# Patient Record
Sex: Male | Born: 1940 | Race: White | Hispanic: No | Marital: Married | Smoking: Former smoker
Health system: Southern US, Community
[De-identification: ages and names within clinical notes are randomized; demographics above are authoritative.]

## PROBLEM LIST (undated history)

## (undated) DIAGNOSIS — Z973 Presence of spectacles and contact lenses: Secondary | ICD-10-CM

## (undated) DIAGNOSIS — K08109 Complete loss of teeth, unspecified cause, unspecified class: Secondary | ICD-10-CM

## (undated) DIAGNOSIS — E785 Hyperlipidemia, unspecified: Secondary | ICD-10-CM

## (undated) DIAGNOSIS — Z9289 Personal history of other medical treatment: Secondary | ICD-10-CM

## (undated) DIAGNOSIS — I10 Essential (primary) hypertension: Secondary | ICD-10-CM

## (undated) DIAGNOSIS — H409 Unspecified glaucoma: Secondary | ICD-10-CM

## (undated) DIAGNOSIS — H532 Diplopia: Secondary | ICD-10-CM

## (undated) DIAGNOSIS — M1990 Unspecified osteoarthritis, unspecified site: Secondary | ICD-10-CM

## (undated) DIAGNOSIS — Z972 Presence of dental prosthetic device (complete) (partial): Secondary | ICD-10-CM

## (undated) DIAGNOSIS — K219 Gastro-esophageal reflux disease without esophagitis: Secondary | ICD-10-CM

## (undated) HISTORY — DX: Gastro-esophageal reflux disease without esophagitis: K21.9

## (undated) HISTORY — DX: Essential (primary) hypertension: I10

## (undated) HISTORY — PX: PROSTATE BIOPSY: SHX241

## (undated) HISTORY — DX: Hyperlipidemia, unspecified: E78.5

---

## 2001-08-12 ENCOUNTER — Encounter: Payer: Self-pay | Admitting: *Deleted

## 2001-08-12 ENCOUNTER — Ambulatory Visit (HOSPITAL_COMMUNITY): Admission: RE | Admit: 2001-08-12 | Discharge: 2001-08-12 | Payer: Self-pay | Admitting: *Deleted

## 2001-11-07 ENCOUNTER — Encounter: Admission: RE | Admit: 2001-11-07 | Discharge: 2002-02-05 | Payer: Self-pay | Admitting: Internal Medicine

## 2004-10-29 HISTORY — PX: COLONOSCOPY: SHX174

## 2005-05-09 ENCOUNTER — Ambulatory Visit: Payer: Self-pay | Admitting: Gastroenterology

## 2005-05-15 ENCOUNTER — Ambulatory Visit: Payer: Self-pay | Admitting: Gastroenterology

## 2007-10-30 HISTORY — PX: EYE SURGERY: SHX253

## 2008-10-29 HISTORY — PX: EYE SURGERY: SHX253

## 2010-03-10 ENCOUNTER — Emergency Department (HOSPITAL_COMMUNITY): Admission: EM | Admit: 2010-03-10 | Discharge: 2010-03-10 | Payer: Self-pay | Admitting: Emergency Medicine

## 2011-01-16 LAB — URINALYSIS, ROUTINE W REFLEX MICROSCOPIC
Glucose, UA: NEGATIVE mg/dL
Nitrite: NEGATIVE
Protein, ur: 100 mg/dL — AB
Specific Gravity, Urine: 1.01 (ref 1.005–1.030)
Urobilinogen, UA: 0.2 mg/dL (ref 0.0–1.0)
pH: 7 (ref 5.0–8.0)

## 2011-01-16 LAB — URINE MICROSCOPIC-ADD ON

## 2011-10-30 DIAGNOSIS — Z9289 Personal history of other medical treatment: Secondary | ICD-10-CM

## 2011-10-30 HISTORY — DX: Personal history of other medical treatment: Z92.89

## 2012-04-11 ENCOUNTER — Encounter: Payer: Self-pay | Admitting: *Deleted

## 2012-08-05 ENCOUNTER — Encounter: Payer: Self-pay | Admitting: Cardiovascular Disease

## 2012-08-08 ENCOUNTER — Other Ambulatory Visit: Payer: Self-pay | Admitting: Urology

## 2012-08-14 ENCOUNTER — Encounter (HOSPITAL_COMMUNITY): Payer: Self-pay | Admitting: Pharmacy Technician

## 2012-08-20 ENCOUNTER — Encounter (HOSPITAL_COMMUNITY)
Admission: RE | Admit: 2012-08-20 | Discharge: 2012-08-20 | Disposition: A | Discharge status: Home / Self Care | Payer: Medicare Other | Source: Ambulatory Visit | Attending: Urology | Admitting: Urology

## 2012-08-20 ENCOUNTER — Ambulatory Visit (HOSPITAL_COMMUNITY)
Admission: RE | Admit: 2012-08-20 | Discharge: 2012-08-20 | Disposition: A | Discharge status: Home / Self Care | Payer: Medicare Other | Source: Ambulatory Visit | Attending: Urology | Admitting: Urology

## 2012-08-20 ENCOUNTER — Encounter (HOSPITAL_COMMUNITY): Payer: Self-pay

## 2012-08-20 DIAGNOSIS — Z01812 Encounter for preprocedural laboratory examination: Secondary | ICD-10-CM | POA: Insufficient documentation

## 2012-08-20 DIAGNOSIS — I10 Essential (primary) hypertension: Secondary | ICD-10-CM | POA: Insufficient documentation

## 2012-08-20 DIAGNOSIS — Z01818 Encounter for other preprocedural examination: Secondary | ICD-10-CM | POA: Insufficient documentation

## 2012-08-20 LAB — BASIC METABOLIC PANEL
Chloride: 104 mEq/L (ref 96–112)
Creatinine, Ser: 0.89 mg/dL (ref 0.50–1.35)
GFR calc Af Amer: >90 mL/min (ref >90)
GFR calc non Af Amer: 83 mL/min — ABNORMAL LOW (ref >90)
Potassium: 4.2 mEq/L (ref 3.5–5.1)

## 2012-08-20 LAB — CBC
MCHC: 33.1 g/dL (ref 30.0–36.0)
Platelets: 263 10*3/uL (ref 150–400)
RDW: 12.7 % (ref 11.5–15.5)
WBC: 7.8 10*3/uL (ref 4.0–10.5)

## 2012-08-20 LAB — SURGICAL PCR SCREEN: Staphylococcus aureus: NEGATIVE

## 2012-08-20 NOTE — Progress Notes (Signed)
Small red rash on abdomen pt instructed to have medicl dr check rash, call rn if any meds given for rash

## 2012-08-20 NOTE — Patient Instructions (Addendum)
20 Ryan Meza  08/20/2012   Your procedure is scheduled on:  08-26-2012  Report to [facility] at 0530  AM.  Call this number if you have problems the morning of surgery: [phone]   Remember:   Do not eat food or drink liquids:After Midnight.  .  Take these medicines the morning of surgery with A SIP OF WATER: combigan eye drop, omeprazole   Do not wear jewelry or make up.  Do not wear lotions, powders, or perfumes. You may wear deodorant.    Do not bring valuables to the hospital.  Contacts, dentures or bridgework may not be worn into surgery.  Leave suitcase in the car. After surgery it may be brought to your room.  For patients admitted to the hospital, checkout time is 11:00 AM the day of discharge                             Patients discharged the day of surgery will not be allowed to drive home. If going home same day of surgery, you must have someone stay with you the first 24 hours at home and arrange for some one to drive you home from hospital.    Special Instructions: See [facility] Preparing for Surgery instruction sheet. Women do not shave legs or underarms for 12 hours before showers. Men may shave face morning of surgery.    Please read over the following fact sheets that you were given: MRSA Information  [facility] pre op nurse phone number [phone], call if needed  Have medicla dr check rash on abdomen call Charlita Brian if any meds given

## 2012-08-22 NOTE — Progress Notes (Signed)
Pt called and said he saw family md and given cream 1% eucerin  3 x day  For rash on abdomen and ok for surgery.

## 2012-08-25 NOTE — H&P (Signed)
History of Present Illness       F/u BPH, elevated PSA and gross hematuria.  1- elevated PSA --2007 PSA 11.67, prostate biopsy benign, 195 g prostate. A 5alphaRI was added to the tamsulosin in 2007.  --Sept 2012 PSA 3.47 (6.94)   2- BPH - he continues on tamsulosin 0.4 mg 1 po daily and finasteride 5 mg 1 po daily.  3- gross hematuria - gross hematuria in 2011. A cystoscopy was negative May 2011 apart from BPH.  4- urinary retention - AUR several yrs ago with foley X 3 days. Presented Oct 2013 with AUR and PVR - 883 ml. Foley was placed.   Interval HX:  Followup review urodynamics.   Urodynamics revealed a bladder capacity of about 300 mL. The bladder had unstable contractions. There is no stress incontinence. He did develop a good voluntary detrusor contraction but had an obstructive flow pattern.  Prostate ultrasound-215 g. Otherwise normal. Details scanned in the chart.   Past Medical History Problems  1. History of  Elective Circumcision V50.2 2. History of  Glaucoma 365.9 3. History of  Hypercholesterolemia 272.0  Surgical History Problems  1. History of  Cataract Surgery 2. History of  Eye Surgery  Current Meds 1. Aspirin 81 MG Oral Tablet; Therapy: (Recorded:31Dec2007) to 2. Combigan 0.2-0.5 % Ophthalmic Solution; Therapy: (Recorded:02Sep2011) to 3. Finasteride 5 MG Oral Tablet; Take 1 tablet by mouth every day; Therapy: 17Feb2010 to  (Evaluate:06Mar2014)  Requested for: 11Mar2013; Last Rx:11Mar2013 4. Levofloxacin 500 MG Oral Tablet; Take 1 tablet daily; Therapy: 06Sep2013 to  (Evaluate:13Sep2013)  Requested for: 06Sep2013; Last Rx:06Sep2013 5. Lipitor 10 MG Oral Tablet; Therapy: (Recorded:27Dec2007) to 6. Lisinopril 5 MG Oral Tablet; Therapy: (Recorded:02Sep2011) to 7. Lumigan 0.01 % Ophthalmic Solution; Therapy: 05Jul2013 to 8. Multi-Vitamin TABS; Therapy: (Recorded:31Dec2007) to 9. PriLOSEC OTC 20 MG Oral Tablet Delayed Release; Therapy: (Recorded:02Dec2008)  to 10. Tamsulosin HCl 0.4 MG Oral Capsule; Take 1 capsule by mouth every day; Therapy: 19May2010   to (Evaluate:25Dec2013)  Requested for: 26Sep2013; Last Rx:26Sep2013 11. Vitamin D3 CAPS; Therapy: (Recorded:26Apr2011) to  Allergies Medication  1. No Known Drug Allergies  Family History Problems  1. Paternal history of  Acute Myocardial Infarction V17.3 2. Paternal history of  Death In The Family Father Massive MIAge of death was 20 3. Maternal history of  Death In The Family Mother Old Age Age of death was 65 4. Family history of  Family Health Status Number Of Children Two sons  Social History Problems  1. Caffeine Use 2 per day maybe 2. Marital History - Currently Married 3. Retired From Work 4. History of  Tobacco Use V15.82 quit 36 years ago; smoked 1 1/2 packs per day for 15 years Denied  5. Alcohol Use  Review of Systems Constitutional, cardiovascular, pulmonary, gastrointestinal and neurological system(s) were reviewed and pertinent findings if present are noted.    Physical Exam Constitutional: Well nourished and well developed . No acute distress.  Pulmonary: No respiratory distress and normal respiratory rhythm and effort.  Cardiovascular: Heart rate and rhythm are normal . No peripheral edema.  Genitourinary: Examination of the penis demonstrates no discharge, no masses, no lesions and a normal meatus. The scrotum is without lesions. The right epididymis is palpably normal and non-tender. The left epididymis is palpably normal and non-tender. The right testis is non-tender and without masses. The left testis is non-tender and without masses.  Neuro/Psych:. Mood and affect are appropriate.    Procedure  Procedure: Cystoscopy   Indication: Lower Urinary  Tract Symptoms.  Informed Consent: Risks, benefits, and potential adverse events were discussed and informed consent was obtained from the patient.  Prep: The patient was prepped with betadine.  Procedure Note:   Urethral meatus:. No abnormalities.  Anterior urethra: No abnormalities.  Prostatic urethra: No abnormalities . The lateral and median prostatic lobes were enlarged. An enlarged intravesical median lobe was visualized.  Bladder: Visulization was clear. The ureteral orifices were in the normal anatomic position bilaterally and had clear efflux of urine. A systematic survey of the bladder demonstrated no bladder tumors or stones. The mucosa was smooth without abnormalities. The patient tolerated the procedure well.     Filled 350 ml. voided 100 mL with a slow stream followed later by another 100 mL with a slow stream.  Complications: None.    Assessment Assessed  1. 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 Follow-up Year x 1 Office  Follow-up Benign Prostatic Hypertrophy With Urinary Obstruction (600.01)  2. Cath, simple, wIinsert Temp Cath  Requested for: 09Oct2013 3. Follow-up Schedule Surgery Office  Follow-up  Requested for: 09Oct2013  Discussion/Summary     Using the understanding prostate problems booklet, I discussed with the patient the nature, risks and benefits of various BPH treatments such as surveillance, alpha blockers (syncope and RGE among others), 5alphaRI's (sexual dysfunction and high grade prostate cancer among others), office based procedures (TUMT or TUNA) and procedures in the OR (Greenlight PVP, Bipolar TURP, open simple prostatectomy). All questions answered. He has a large median lobe and may do well after resecting this. He elects to proceed with TURP. We discussed risks of bleeding, infection, persistent retention, incontinence, strictures, fluid overload, thrombotic events, anesthetic complications, worsening/de novo ur and gency, and sexual dysfunction (impotence/retrograde ejaculation) among others. We talked about the 85 to 90 percent success rate with flow symptoms. He understands that  statistically, overactive bladder symptoms could settle down in 65 percent of the cases, persist in a third and worsen in 2 to 3 percent. We discussed treatment goals may not be reached and/or may require multiple procedures. We discussed the likelihood of achieving the goals of the procedure and potential problems that might occur during the procedure or recuperation.       Signatures Electronically signed by : Jerilee Field, M.D.; Aug 06 2012 12:20PM

## 2012-08-26 ENCOUNTER — Encounter (HOSPITAL_COMMUNITY): Payer: Self-pay | Admitting: Anesthesiology

## 2012-08-26 ENCOUNTER — Ambulatory Visit (HOSPITAL_COMMUNITY): Payer: Medicare Other | Admitting: Anesthesiology

## 2012-08-26 ENCOUNTER — Encounter (HOSPITAL_COMMUNITY): Payer: Self-pay | Admitting: *Deleted

## 2012-08-26 ENCOUNTER — Ambulatory Visit (HOSPITAL_COMMUNITY)
Admission: RE | Admit: 2012-08-26 | Discharge: 2012-08-26 | Disposition: A | Discharge status: Home / Self Care | Payer: Medicare Other | Source: Ambulatory Visit | Attending: Urology | Admitting: Urology

## 2012-08-26 ENCOUNTER — Encounter (HOSPITAL_COMMUNITY)
Admission: RE | Disposition: A | Discharge status: Home / Self Care | Payer: Self-pay | Source: Ambulatory Visit | Attending: Urology

## 2012-08-26 DIAGNOSIS — E7800 Pure hypercholesterolemia, unspecified: Secondary | ICD-10-CM | POA: Insufficient documentation

## 2012-08-26 DIAGNOSIS — Z79899 Other long term (current) drug therapy: Secondary | ICD-10-CM | POA: Insufficient documentation

## 2012-08-26 DIAGNOSIS — N138 Other obstructive and reflux uropathy: Secondary | ICD-10-CM | POA: Insufficient documentation

## 2012-08-26 DIAGNOSIS — N401 Enlarged prostate with lower urinary tract symptoms: Secondary | ICD-10-CM | POA: Insufficient documentation

## 2012-08-26 DIAGNOSIS — R972 Elevated prostate specific antigen [PSA]: Secondary | ICD-10-CM | POA: Insufficient documentation

## 2012-08-26 DIAGNOSIS — Z7982 Long term (current) use of aspirin: Secondary | ICD-10-CM | POA: Insufficient documentation

## 2012-08-26 DIAGNOSIS — R338 Other retention of urine: Secondary | ICD-10-CM | POA: Insufficient documentation

## 2012-08-26 DIAGNOSIS — R310 Gross hematuria: Secondary | ICD-10-CM | POA: Insufficient documentation

## 2012-08-26 HISTORY — PX: CYSTOSCOPY: SHX5120

## 2012-08-26 SURGERY — CYSTOSCOPY
Anesthesia: General | Site: Bladder | Wound class: Clean Contaminated

## 2012-08-26 MED ORDER — FUROSEMIDE 10 MG/ML IJ SOLN
INTRAMUSCULAR | Status: DC | PRN
  Administered 2012-08-26: 20 mg via INTRAMUSCULAR

## 2012-08-26 MED ORDER — PROMETHAZINE HCL 25 MG/ML IJ SOLN: 6.2500 mg | INTRAMUSCULAR | Status: DC | PRN

## 2012-08-26 MED ORDER — FUROSEMIDE 10 MG/ML IJ SOLN
20.0000 mg | Freq: Once | INTRAMUSCULAR | Status: DC
  Filled 2012-08-26: qty 2

## 2012-08-26 MED ORDER — INDIGOTINDISULFONATE SODIUM 8 MG/ML IJ SOLN
INTRAMUSCULAR | Status: DC | PRN
  Administered 2012-08-26: 5 mL via INTRAVENOUS

## 2012-08-26 MED ORDER — KETOROLAC TROMETHAMINE 30 MG/ML IJ SOLN: 15.0000 mg | Freq: Once | INTRAMUSCULAR | Status: DC | PRN

## 2012-08-26 MED ORDER — LIDOCAINE HCL (CARDIAC) 20 MG/ML IV SOLN
INTRAVENOUS | Status: DC | PRN
  Administered 2012-08-26: 70 mg via INTRAVENOUS

## 2012-08-26 MED ORDER — LEVOFLOXACIN 500 MG PO TABS: 500.0000 mg | ORAL_TABLET | Freq: Every day | ORAL | Status: DC

## 2012-08-26 MED ORDER — CEFAZOLIN SODIUM-DEXTROSE 2-3 GM-% IV SOLR
INTRAVENOUS | Status: DC | PRN
  Administered 2012-08-26: 2 g via INTRAVENOUS

## 2012-08-26 MED ORDER — LIDOCAINE HCL 2 % EX GEL
CUTANEOUS | Status: AC
  Filled 2012-08-26: qty 10

## 2012-08-26 MED ORDER — MIDAZOLAM HCL 5 MG/5ML IJ SOLN
INTRAMUSCULAR | Status: DC | PRN
  Administered 2012-08-26: 2 mg via INTRAVENOUS

## 2012-08-26 MED ORDER — ACETAMINOPHEN 10 MG/ML IV SOLN
INTRAVENOUS | Status: AC
  Filled 2012-08-26: qty 100

## 2012-08-26 MED ORDER — PROPOFOL 10 MG/ML IV BOLUS
INTRAVENOUS | Status: DC | PRN
  Administered 2012-08-26: 100 mg via INTRAVENOUS
  Administered 2012-08-26: 200 mg via INTRAVENOUS

## 2012-08-26 MED ORDER — EPHEDRINE SULFATE 50 MG/ML IJ SOLN
INTRAMUSCULAR | Status: DC | PRN
  Administered 2012-08-26 (×2): 5 mg via INTRAVENOUS
  Administered 2012-08-26: 15 mg via INTRAVENOUS
  Administered 2012-08-26 (×3): 5 mg via INTRAVENOUS

## 2012-08-26 MED ORDER — CEFAZOLIN SODIUM-DEXTROSE 2-3 GM-% IV SOLR: 2.0000 g | INTRAVENOUS | Status: DC

## 2012-08-26 MED ORDER — BELLADONNA ALKALOIDS-OPIUM 16.2-60 MG RE SUPP
RECTAL | Status: AC
  Filled 2012-08-26: qty 1

## 2012-08-26 MED ORDER — FENTANYL CITRATE 0.05 MG/ML IJ SOLN
INTRAMUSCULAR | Status: AC
  Filled 2012-08-26: qty 2

## 2012-08-26 MED ORDER — CEFAZOLIN SODIUM-DEXTROSE 2-3 GM-% IV SOLR
INTRAVENOUS | Status: AC
  Filled 2012-08-26: qty 50

## 2012-08-26 MED ORDER — FENTANYL CITRATE 0.05 MG/ML IJ SOLN
INTRAMUSCULAR | Status: DC | PRN
  Administered 2012-08-26 (×3): 25 ug via INTRAVENOUS

## 2012-08-26 MED ORDER — FENTANYL CITRATE 0.05 MG/ML IJ SOLN
25.0000 ug | INTRAMUSCULAR | Status: DC | PRN
  Administered 2012-08-26: 25 ug via INTRAVENOUS

## 2012-08-26 MED ORDER — LACTATED RINGERS IV SOLN
INTRAVENOUS | Status: DC | PRN
  Administered 2012-08-26 (×2): via INTRAVENOUS

## 2012-08-26 MED ORDER — GLYCOPYRROLATE 0.2 MG/ML IJ SOLN
INTRAMUSCULAR | Status: DC | PRN
  Administered 2012-08-26 (×2): 0.1 mg via INTRAVENOUS

## 2012-08-26 MED ORDER — SODIUM CHLORIDE 0.9 % IR SOLN
Status: DC | PRN
  Administered 2012-08-26: 3000 mL via INTRAVESICAL

## 2012-08-26 MED ORDER — ONDANSETRON HCL 4 MG/2ML IJ SOLN
INTRAMUSCULAR | Status: DC | PRN
  Administered 2012-08-26: 4 mg via INTRAVENOUS

## 2012-08-26 MED ORDER — ASPIRIN 81 MG PO TABS: 81.0000 mg | ORAL_TABLET | ORAL | Status: DC

## 2012-08-26 MED ORDER — ACETAMINOPHEN 10 MG/ML IV SOLN
INTRAVENOUS | Status: DC | PRN
  Administered 2012-08-26: 1000 mg via INTRAVENOUS

## 2012-08-26 SURGICAL SUPPLY — 15 items
BAG URINE DRAINAGE (UROLOGICAL SUPPLIES) ×3 IMPLANT
BAG URO CATCHER STRL LF (DRAPE) ×3 IMPLANT
CATH FOLEY 3WAY 30CC 20FR (CATHETERS) ×2 IMPLANT
CLOTH BEACON ORANGE TIMEOUT ST (SAFETY) ×3 IMPLANT
DRAPE CAMERA CLOSED 9X96 (DRAPES) ×3 IMPLANT
ELECT LOOP MED HF 24F 12D CBL (CLIP) ×2 IMPLANT
ELECT REM PT RETURN 9FT ADLT (ELECTROSURGICAL) ×3
ELECTRODE REM PT RTRN 9FT ADLT (ELECTROSURGICAL) ×2 IMPLANT
GLOVE BIOGEL M STRL SZ7.5 (GLOVE) ×3 IMPLANT
GOWN STRL REIN XL XLG (GOWN DISPOSABLE) ×3 IMPLANT
HOLDER FOLEY CATH W/STRAP (MISCELLANEOUS) ×2 IMPLANT
MANIFOLD NEPTUNE II (INSTRUMENTS) ×3 IMPLANT
PACK CYSTO (CUSTOM PROCEDURE TRAY) ×3 IMPLANT
SYR 30ML LL (SYRINGE) ×2 IMPLANT
TUBING CONNECTING 10 (TUBING) ×3 IMPLANT

## 2012-08-26 NOTE — Op Note (Signed)
Preop diagnosis: BPH, urinary retention  Postoperative diagnoses: BPH, urinary retention  Procedure: Exam under anesthesia, cystoscopy  Surgeon: Mena Goes  Anesthesia: Gen.  Findings: Exam under anesthesia-the penis and testicles were normal without lesion or mass. On digital rectal exam the prostate was smooth without heart area nodule. Cystoscopy-there was significant median lobe component which was actually bilobar with a large right lateral component protruding into the bladder pushing over the more medial hypertrophied lobe. Both of these lobes protruding into the bladder an obstructing the bladder neck. The right ureteral orifice was readily identified, the left ureteral orifice could not be identified despite IV injection of indigo and Lasix. Also inspection the 12 30 and 70 lenses. On review of the patient's chart he has not had abdominal/renal imaging.  Description of procedure: After consent was obtained patient brought to the operating room. A timeout was performed to confirm the patient and procedure. After adequate anesthesia he was placed in lithotomy position and an exam under anesthesia was performed. He was prepped and draped in usual fashion. Cystoscope was passed per urethra and the prostate was quite large with many friable vessels. Visualization was poor due to bleeding. Therefore the 25 [place] resectoscope was passed with the visual obturator then the loop was placed with the resectoscope handle. The right ureteral orifice was identified but as above the left ureteral orifice could not be identified. Given the unique nature of the bilobar R. median lobe was I did not want to resect risking resection and fulguration of the left ureteral orifice. The patient was then awakened and taken to recovery room in stable condition.  Complications: None  Estimated blood loss: Minimal  Drains: 20 French three-way Foley  Disposition: Patient awakened taken to the PACU in stable  condition. I'll see him in the office with a renal ultrasound in 1-2 weeks. He will likely need an open simple prostatectomy.

## 2012-08-26 NOTE — Progress Notes (Signed)
Pt returned to short stay with bladder irrigation set up . Urine is pink with no clots. Up and walked in hall and tolerated well. Bladder irrigation removed and leg bag applied.

## 2012-08-26 NOTE — Anesthesia Postprocedure Evaluation (Signed)
  Anesthesia Post-op Note  Patient: Ryan Meza  Procedure(s) Performed: Procedure(s) (LRB): CYSTOSCOPY (N/A)  Patient Location: PACU  Anesthesia Type: General  Level of Consciousness: awake and alert   Airway and Oxygen Therapy: Patient Spontanous Breathing  Post-op Pain: mild  Post-op Assessment: Post-op Vital signs reviewed, Patient's Cardiovascular Status Stable, Respiratory Function Stable, Patent Airway and No signs of Nausea or vomiting  Post-op Vital Signs: stable  Complications: No apparent anesthesia complications

## 2012-08-26 NOTE — Interval H&P Note (Signed)
History and Physical Interval Note:  08/26/2012 7:25 AM  Ryan Meza  has presented today for surgery, with the diagnosis of BPH WITH BLADDER OUTLET OBSTRUCTION  The various methods of treatment have been discussed with the patient and family. After consideration of risks, benefits and other options for treatment, the patient has consented to  Procedure(s) (LRB) with comments: TRANSURETHRAL RESECTION OF THE PROSTATE (TURP) (N/A) as a surgical intervention .  The patient's history has been reviewed, patient examined, no change in status, stable for surgery.  I have reviewed the patient's chart and labs. We discussed the side effects of the proposed treatment, the likelihood of the patient achieving the goals of the procedure, and any potential problems that might occur during the procedure or recuperation. Questions were answered to the patient's satisfaction.     [name]

## 2012-08-26 NOTE — Anesthesia Preprocedure Evaluation (Signed)
Anesthesia Evaluation  Patient identified by MRN, date of birth, ID band Patient awake    Reviewed: Allergy & Precautions, H&P , NPO status , Patient's Chart, lab work & pertinent test results  Airway Mallampati: II TM Distance: <3 FB Neck ROM: Full    Dental No notable dental hx.    Pulmonary neg pulmonary ROS,  breath sounds clear to auscultation  Pulmonary exam normal       Cardiovascular hypertension, Pt. on medications Rhythm:Regular Rate:Normal     Neuro/Psych negative neurological ROS  negative psych ROS   GI/Hepatic Neg liver ROS, GERD-  Medicated,  Endo/Other  negative endocrine ROS  Renal/GU negative Renal ROS  negative genitourinary   Musculoskeletal negative musculoskeletal ROS (+)   Abdominal   Peds negative pediatric ROS (+)  Hematology negative hematology ROS (+)   Anesthesia Other Findings   Reproductive/Obstetrics negative OB ROS                           Anesthesia Physical Anesthesia Plan  ASA: II  Anesthesia Plan: General   Post-op Pain Management:    Induction: Intravenous  Airway Management Planned: LMA  Additional Equipment:   Intra-op Plan:   Post-operative Plan:   Informed Consent: I have reviewed the patients History and Physical, chart, labs and discussed the procedure including the risks, benefits and alternatives for the proposed anesthesia with the patient or authorized representative who has indicated his/her understanding and acceptance.   Dental advisory given  Plan Discussed with: CRNA and Surgeon  Anesthesia Plan Comments:         Anesthesia Quick Evaluation  

## 2012-08-26 NOTE — Transfer of Care (Signed)
Immediate Anesthesia Transfer of Care Note  Patient: Ryan Meza  Procedure(s) Performed: Procedure(s) (LRB) with comments: CYSTOSCOPY (N/A)  Patient Location: PACU  Anesthesia Type:General  Level of Consciousness: awake and alert   Airway & Oxygen Therapy: Patient Spontanous Breathing and Patient connected to face mask oxygen  Post-op Assessment: Report given to PACU RN and Post -op Vital signs reviewed and stable  Post vital signs: Reviewed and stable  Complications: No apparent anesthesia complications

## 2012-08-27 ENCOUNTER — Encounter (HOSPITAL_COMMUNITY): Payer: Self-pay | Admitting: Urology

## 2012-09-12 ENCOUNTER — Other Ambulatory Visit: Payer: Self-pay | Admitting: Urology

## 2012-09-22 ENCOUNTER — Encounter (HOSPITAL_COMMUNITY): Payer: Self-pay | Admitting: Pharmacy Technician

## 2012-09-24 ENCOUNTER — Encounter (HOSPITAL_COMMUNITY)
Admission: RE | Admit: 2012-09-24 | Discharge: 2012-09-24 | Disposition: A | Discharge status: Home / Self Care | Payer: Medicare Other | Source: Ambulatory Visit | Attending: Urology | Admitting: Urology

## 2012-09-24 ENCOUNTER — Other Ambulatory Visit (HOSPITAL_COMMUNITY): Payer: Self-pay | Admitting: *Deleted

## 2012-09-24 ENCOUNTER — Encounter (HOSPITAL_COMMUNITY): Payer: Self-pay

## 2012-09-24 LAB — BASIC METABOLIC PANEL
BUN: 17 mg/dL (ref 6–23)
Chloride: 104 mEq/L (ref 96–112)
GFR calc Af Amer: 79 mL/min — ABNORMAL LOW (ref >90)
Glucose, Bld: 99 mg/dL (ref 70–99)
Potassium: 4.3 mEq/L (ref 3.5–5.1)

## 2012-09-24 LAB — SURGICAL PCR SCREEN: Staphylococcus aureus: NEGATIVE

## 2012-09-24 LAB — CBC
HCT: 41 % (ref 39.0–52.0)
Hemoglobin: 13.9 g/dL (ref 13.0–17.0)
MCH: 29.7 pg (ref 26.0–34.0)
MCHC: 33.9 g/dL (ref 30.0–36.0)
RBC: 4.68 MIL/uL (ref 4.22–5.81)

## 2012-09-24 NOTE — Patient Instructions (Signed)
20      Your procedure is scheduled on:  Friday 10/03/2012 at 0730 am  Report to [facility] at 0530  AM.  Call this number if you have problems the morning of surgery: [phone]   Remember:   Do not eat food or drink liquids after midnight!  Take these medicines the morning of surgery with A SIP OF WATER:Prilosec, Flomax, Finesteride, use Combigan opth. solution    Do not bring valuables to the hospital.  .  Leave suitcase in the car. After surgery it may be brought to your room.  For patients admitted to the hospital, checkout time is 11:00 AM the day of              Discharge.    Special Instructions: See [facility] Preparing  For Surgery Instruction Sheet. Do not wear jewelry, lotions powders, perfumes. Women do not shave legs or underarms for 12 hours before showers. Contacts, partial plates, or dentures may not be worn into surgery.                          Patients discharged the day of surgery will not be allowed to drive home. If going home the same day of surgery, must have someone stay with you first 24 hrs.at home and arrange for someone to drive you home from the              Hospital. YOUR DRIVER IS:   Please read over the following fact sheets that you were given: MRSA INFORMATION,INCENTIVE SPIROMETRY SHEET, SLEEP APNEA SHEET, BLOOD TRANSFUSION                            Telford Nab.Navika Hoopes,RN,BSN     [phone]

## 2012-09-29 NOTE — H&P (Signed)
History of Present Illness      F/u BPH, elevated PSA and gross hematuria.  1- elevated PSA --2007 PSA 11.67, prostate biopsy benign, 195 g prostate. A 5alphaRI was added to the tamsulosin in 2007.  --Sept 2012 PSA 3.47 (6.94)   2- BPH - he continues on tamsulosin 0.4 mg 1 po daily and finasteride 5 mg 1 po daily.  3- gross hematuria - gross hematuria in 2011. A cystoscopy was negative May 2011 apart from BPH.  4- urinary retention - AUR several yrs ago with foley X 3 days. Presented Oct 2013 with AUR and PVR - 883 ml. Foley was placed.   Urodynamics revealed a bladder capacity of about 300 mL. The bladder had unstable contractions. There is no stress incontinence. He did develop a good voluntary detrusor contraction but had an obstructive flow pattern.  Prostate ultrasound-215 g. Otherwise normal. Details scanned in the chart.   Cystoscopy - large median lobe, lateral lobe hypertrophy  Interval Hx Patient returns and underwent cystoscopy in the OR. I could not identify a left UO, therefore I held off on TURP. He had a large median lobe and lateral lobe protruding into bladder. His foley is draining well. He has had no fever or chills.   Renal/bladder US today - there is a normal right and left kidney without hydronephrosis or mass. There are several small calcifications in both kidneys. There is no hydronephrosis.  There is a cystic area in the right kidney possible three cysts 1.2, 2.07 and 2.26 cm. See tech sheet for detail. I reviewed all the images. Bladder decompressed with foley. Large median lobe seen.   Past Medical History Problems  1. History of  Elective Circumcision V50.2 2. History of  Glaucoma 365.9 3. History of  Hypercholesterolemia 272.0  Surgical History Problems  1. History of  Cataract Surgery 2. History of  Cystoscopy (Diagnostic) 3. History of  Eye Surgery  Current Meds 1. Aspirin 81 MG Oral Tablet; Therapy: (Recorded:31Dec2007) to 2. Combigan 0.2-0.5 %  Ophthalmic Solution; Therapy: (Recorded:02Sep2011) to 3. Finasteride 5 MG Oral Tablet; Take 1 tablet by mouth every day; Therapy: 17Feb2010 to  (Evaluate:06Mar2014)  Requested for: 11Mar2013; Last Rx:11Mar2013 4. Lipitor 10 MG Oral Tablet; Therapy: (Recorded:27Dec2007) to 5. Lisinopril 5 MG Oral Tablet; Therapy: (Recorded:02Sep2011) to 6. Lumigan 0.01 % Ophthalmic Solution; Therapy: 05Jul2013 to 7. Multi-Vitamin TABS; Therapy: (Recorded:31Dec2007) to 8. PriLOSEC OTC 20 MG Oral Tablet Delayed Release; Therapy: (Recorded:02Dec2008) to 9. Tamsulosin HCl 0.4 MG Oral Capsule; Take 1 capsule by mouth every day; Therapy: 19May2010  to (Evaluate:25Dec2013)  Requested for: 26Sep2013; Last Rx:26Sep2013 10. Triamcinolone Acetonide 0.1 % External Cream; Therapy: 24Oct2013 to 11. Vitamin D3 CAPS; Therapy: (Recorded:26Apr2011) to  Allergies Medication  1. No Known Drug Allergies  Family History Problems  1. Paternal history of  Acute Myocardial Infarction V17.3 2. Paternal history of  Death In The Family Father Massive MIAge of death was 55 3. Maternal history of  Death In The Family Mother Old Age Age of death was 21 4. Family history of  Family Health Status Number Of Children Two sons  Social History Problems  1. Caffeine Use 2 per day maybe 2. Marital History - Currently Married 3. Retired From Work 4. History of  Tobacco Use V15.82 quit 36 years ago; smoked 1 1/2 packs per day for 15 years Denied  5. Alcohol Use  Review of Systems Constitutional, skin, eye, otolaryngeal, hematologic/lymphatic, cardiovascular, pulmonary, endocrine, musculoskeletal, gastrointestinal, neurological and psychiatric system(s) were reviewed and pertinent  findings if present are noted.    Physical Exam Constitutional: Well nourished and well developed . No acute distress.  Pulmonary: No respiratory distress and normal respiratory rhythm and effort.  Cardiovascular: Heart rate and rhythm are normal . No  peripheral edema.  Neuro/Psych:. Mood and affect are appropriate.    Assessment Assessed  1. 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 Follow-up Schedule Surgery Office  Follow-up  Done: 14Nov2013  Discussion/Summary     Using the understanding prostate problems booklet we went back over the anatomy. We went over again the nature, R/B of continued foley, CIC, TURP or open simple. We talked about the differences in TURP which would likely be staged compared to open simple which would be more invasive and have a longer recovery. He would like to proceed with open simple after furthur discussion. We discussed the risk of bleeding with open simple and the need for blood transfusion. We discussed post-op concern for scar tissue/stricture, incontinence, de novo or worsening irritative symptoms and continued retention/need for foley catheter among others.      Signatures Electronically signed by : Jerilee Field, M.D.; Sep 11 2012  4:24PM

## 2012-10-03 ENCOUNTER — Encounter (HOSPITAL_COMMUNITY): Payer: Self-pay | Admitting: Anesthesiology

## 2012-10-03 ENCOUNTER — Observation Stay (HOSPITAL_COMMUNITY)
Admission: RE | Admit: 2012-10-03 | Discharge: 2012-10-06 | Disposition: A | Discharge status: Home / Self Care | Payer: Medicare Other | Source: Ambulatory Visit | Attending: Urology | Admitting: Urology

## 2012-10-03 ENCOUNTER — Encounter (HOSPITAL_COMMUNITY): Payer: Self-pay | Admitting: *Deleted

## 2012-10-03 ENCOUNTER — Encounter (HOSPITAL_COMMUNITY)
Admission: RE | Disposition: A | Discharge status: Home / Self Care | Payer: Self-pay | Source: Ambulatory Visit | Attending: Urology

## 2012-10-03 ENCOUNTER — Ambulatory Visit (HOSPITAL_COMMUNITY): Payer: Medicare Other | Admitting: Anesthesiology

## 2012-10-03 DIAGNOSIS — R944 Abnormal results of kidney function studies: Secondary | ICD-10-CM | POA: Insufficient documentation

## 2012-10-03 DIAGNOSIS — Z466 Encounter for fitting and adjustment of urinary device: Secondary | ICD-10-CM | POA: Insufficient documentation

## 2012-10-03 DIAGNOSIS — N401 Enlarged prostate with lower urinary tract symptoms: Secondary | ICD-10-CM | POA: Insufficient documentation

## 2012-10-03 DIAGNOSIS — N138 Other obstructive and reflux uropathy: Principal | ICD-10-CM | POA: Insufficient documentation

## 2012-10-03 DIAGNOSIS — I959 Hypotension, unspecified: Secondary | ICD-10-CM | POA: Insufficient documentation

## 2012-10-03 DIAGNOSIS — Z01812 Encounter for preprocedural laboratory examination: Secondary | ICD-10-CM | POA: Insufficient documentation

## 2012-10-03 DIAGNOSIS — Z7982 Long term (current) use of aspirin: Secondary | ICD-10-CM | POA: Insufficient documentation

## 2012-10-03 DIAGNOSIS — Z79899 Other long term (current) drug therapy: Secondary | ICD-10-CM | POA: Insufficient documentation

## 2012-10-03 DIAGNOSIS — E7800 Pure hypercholesterolemia, unspecified: Secondary | ICD-10-CM | POA: Insufficient documentation

## 2012-10-03 DIAGNOSIS — R338 Other retention of urine: Secondary | ICD-10-CM | POA: Insufficient documentation

## 2012-10-03 DIAGNOSIS — I498 Other specified cardiac arrhythmias: Secondary | ICD-10-CM | POA: Insufficient documentation

## 2012-10-03 HISTORY — PX: PROSTATECTOMY: SHX69

## 2012-10-03 LAB — BASIC METABOLIC PANEL
BUN: 19 mg/dL (ref 6–23)
Chloride: 106 mEq/L (ref 96–112)
Creatinine, Ser: 1.48 mg/dL — ABNORMAL HIGH (ref 0.50–1.35)
Glucose, Bld: 178 mg/dL — ABNORMAL HIGH (ref 70–99)
Potassium: 4.9 mEq/L (ref 3.5–5.1)

## 2012-10-03 LAB — ABO/RH: ABO/RH(D): O POS

## 2012-10-03 SURGERY — PROSTATECTOMY
Anesthesia: General | Site: Prostate | Wound class: Clean Contaminated

## 2012-10-03 MED ORDER — DIPHENHYDRAMINE HCL 12.5 MG/5ML PO ELIX: 12.5000 mg | ORAL_SOLUTION | Freq: Four times a day (QID) | ORAL | Status: DC | PRN

## 2012-10-03 MED ORDER — OXYCODONE HCL 5 MG PO TABS: 5.0000 mg | ORAL_TABLET | Freq: Once | ORAL | Status: DC | PRN

## 2012-10-03 MED ORDER — ONDANSETRON HCL 4 MG/2ML IJ SOLN
INTRAMUSCULAR | Status: DC | PRN
  Administered 2012-10-03: 4 mg via INTRAVENOUS

## 2012-10-03 MED ORDER — BRIMONIDINE TARTRATE 0.2 % OP SOLN
1.0000 [drp] | Freq: Two times a day (BID) | OPHTHALMIC | Status: DC
  Administered 2012-10-03 – 2012-10-06 (×6): 1 [drp] via OPHTHALMIC
  Filled 2012-10-03: qty 5

## 2012-10-03 MED ORDER — SODIUM CHLORIDE 0.9 % IV BOLUS (SEPSIS)
1000.0000 mL | Freq: Once | INTRAVENOUS | Status: AC
  Administered 2012-10-03: 1000 mL via INTRAVENOUS

## 2012-10-03 MED ORDER — FENTANYL CITRATE 0.05 MG/ML IJ SOLN
INTRAMUSCULAR | Status: DC | PRN
  Administered 2012-10-03 (×2): 100 ug via INTRAVENOUS
  Administered 2012-10-03: 50 ug via INTRAVENOUS

## 2012-10-03 MED ORDER — GLYCOPYRROLATE 0.2 MG/ML IJ SOLN
INTRAMUSCULAR | Status: DC | PRN
  Administered 2012-10-03: 0.6 mg via INTRAVENOUS

## 2012-10-03 MED ORDER — BRIMONIDINE TARTRATE-TIMOLOL 0.2-0.5 % OP SOLN: 1.0000 [drp] | Freq: Two times a day (BID) | OPHTHALMIC | Status: DC

## 2012-10-03 MED ORDER — ATORVASTATIN CALCIUM 40 MG PO TABS
40.0000 mg | ORAL_TABLET | ORAL | Status: DC
  Administered 2012-10-04 – 2012-10-05 (×2): 40 mg via ORAL
  Filled 2012-10-03 (×2): qty 1

## 2012-10-03 MED ORDER — LIDOCAINE HCL 1 % IJ SOLN
INTRAMUSCULAR | Status: DC | PRN
  Administered 2012-10-03: 50 mg via INTRADERMAL

## 2012-10-03 MED ORDER — STERILE WATER FOR IRRIGATION IR SOLN
Status: DC | PRN
  Administered 2012-10-03: 500 mL

## 2012-10-03 MED ORDER — HETASTARCH-ELECTROLYTES 6 % IV SOLN
INTRAVENOUS | Status: DC | PRN
  Administered 2012-10-03: 09:00:00 via INTRAVENOUS

## 2012-10-03 MED ORDER — ONDANSETRON HCL 4 MG/2ML IJ SOLN
4.0000 mg | INTRAMUSCULAR | Status: DC | PRN
  Administered 2012-10-03: 4 mg via INTRAVENOUS
  Filled 2012-10-03: qty 2

## 2012-10-03 MED ORDER — VITAMIN D3 25 MCG (1000 UNIT) PO TABS
1000.0000 [IU] | ORAL_TABLET | Freq: Every day | ORAL | Status: DC
  Filled 2012-10-03: qty 1

## 2012-10-03 MED ORDER — NEOSTIGMINE METHYLSULFATE 1 MG/ML IJ SOLN
INTRAMUSCULAR | Status: DC | PRN
  Administered 2012-10-03: 4 mg via INTRAVENOUS

## 2012-10-03 MED ORDER — HYDROMORPHONE HCL PF 1 MG/ML IJ SOLN
0.2500 mg | INTRAMUSCULAR | Status: DC | PRN
  Administered 2012-10-03 (×4): 0.5 mg via INTRAVENOUS

## 2012-10-03 MED ORDER — SODIUM CHLORIDE 0.9 % IR SOLN
3000.0000 mL | Status: DC
  Administered 2012-10-03 – 2012-10-05 (×7): 3000 mL

## 2012-10-03 MED ORDER — MEPERIDINE HCL 50 MG/ML IJ SOLN: 6.2500 mg | INTRAMUSCULAR | Status: DC | PRN

## 2012-10-03 MED ORDER — LISINOPRIL 5 MG PO TABS
5.0000 mg | ORAL_TABLET | Freq: Every day | ORAL | Status: DC
Start: 2012-10-03 — End: 2012-10-06
  Administered 2012-10-03 – 2012-10-05 (×3): 5 mg via ORAL
  Filled 2012-10-03 (×4): qty 1

## 2012-10-03 MED ORDER — HYDROMORPHONE HCL PF 1 MG/ML IJ SOLN
0.5000 mg | INTRAMUSCULAR | Status: DC | PRN
  Administered 2012-10-03 – 2012-10-04 (×2): 1 mg via INTRAVENOUS
  Filled 2012-10-03 (×2): qty 1

## 2012-10-03 MED ORDER — ZOLPIDEM TARTRATE 5 MG PO TABS: 5.0000 mg | ORAL_TABLET | Freq: Every evening | ORAL | Status: DC | PRN

## 2012-10-03 MED ORDER — OXYCODONE-ACETAMINOPHEN 5-325 MG PO TABS: 1.0000 | ORAL_TABLET | ORAL | Status: DC | PRN

## 2012-10-03 MED ORDER — ACETAMINOPHEN 325 MG PO TABS
650.0000 mg | ORAL_TABLET | ORAL | Status: DC | PRN
  Administered 2012-10-04: 650 mg via ORAL
  Filled 2012-10-03: qty 2

## 2012-10-03 MED ORDER — VITAMIN D3 25 MCG (1000 UNIT) PO TABS
1000.0000 [IU] | ORAL_TABLET | Freq: Every day | ORAL | Status: DC
  Administered 2012-10-03 – 2012-10-06 (×3): 1000 [IU] via ORAL
  Filled 2012-10-03 (×4): qty 1

## 2012-10-03 MED ORDER — DOCUSATE SODIUM 100 MG PO CAPS: 100.0000 mg | ORAL_CAPSULE | Freq: Two times a day (BID) | ORAL | Status: DC

## 2012-10-03 MED ORDER — PANTOPRAZOLE SODIUM 40 MG PO TBEC
40.0000 mg | DELAYED_RELEASE_TABLET | Freq: Every day | ORAL | Status: DC
  Administered 2012-10-04 – 2012-10-06 (×3): 40 mg via ORAL
  Filled 2012-10-03 (×4): qty 1

## 2012-10-03 MED ORDER — ROCURONIUM BROMIDE 100 MG/10ML IV SOLN
INTRAVENOUS | Status: DC | PRN
  Administered 2012-10-03 (×2): 10 mg via INTRAVENOUS
  Administered 2012-10-03: 40 mg via INTRAVENOUS

## 2012-10-03 MED ORDER — FINASTERIDE 5 MG PO TABS
5.0000 mg | ORAL_TABLET | Freq: Every morning | ORAL | Status: DC
  Administered 2012-10-03 – 2012-10-06 (×4): 5 mg via ORAL
  Filled 2012-10-03 (×4): qty 1

## 2012-10-03 MED ORDER — BIMATOPROST 0.03 % OP SOLN
1.0000 [drp] | Freq: Every day | OPHTHALMIC | Status: DC
Start: 2012-10-03 — End: 2012-10-03
  Filled 2012-10-03: qty 2.5

## 2012-10-03 MED ORDER — SODIUM CHLORIDE 0.9 % IR SOLN
Status: DC | PRN
  Administered 2012-10-03: 3000 mL

## 2012-10-03 MED ORDER — FINASTERIDE 5 MG PO TABS
5.0000 mg | ORAL_TABLET | Freq: Every morning | ORAL | Status: DC
  Filled 2012-10-03: qty 1

## 2012-10-03 MED ORDER — PROMETHAZINE HCL 25 MG/ML IJ SOLN: 6.2500 mg | INTRAMUSCULAR | Status: DC | PRN

## 2012-10-03 MED ORDER — CEFAZOLIN SODIUM 1-5 GM-% IV SOLN
1.0000 g | Freq: Three times a day (TID) | INTRAVENOUS | Status: AC
  Administered 2012-10-03 (×2): 1 g via INTRAVENOUS
  Filled 2012-10-03 (×2): qty 50

## 2012-10-03 MED ORDER — TIMOLOL MALEATE 0.5 % OP SOLN
1.0000 [drp] | Freq: Two times a day (BID) | OPHTHALMIC | Status: DC
  Administered 2012-10-03 – 2012-10-06 (×6): 1 [drp] via OPHTHALMIC
  Filled 2012-10-03: qty 5

## 2012-10-03 MED ORDER — LACTATED RINGERS IV SOLN
INTRAVENOUS | Status: DC
  Administered 2012-10-04 – 2012-10-05 (×2): via INTRAVENOUS

## 2012-10-03 MED ORDER — ATORVASTATIN CALCIUM 80 MG PO TABS
80.0000 mg | ORAL_TABLET | ORAL | Status: DC
  Administered 2012-10-03 – 2012-10-06 (×2): 80 mg via ORAL
  Filled 2012-10-03 (×2): qty 1

## 2012-10-03 MED ORDER — BACITRACIN-NEOMYCIN-POLYMYXIN 400-5-5000 EX OINT: 1.0000 "application " | TOPICAL_OINTMENT | Freq: Three times a day (TID) | CUTANEOUS | Status: DC | PRN

## 2012-10-03 MED ORDER — ATORVASTATIN CALCIUM 40 MG PO TABS: 40.0000 mg | ORAL_TABLET | Freq: Every evening | ORAL | Status: DC

## 2012-10-03 MED ORDER — BIMATOPROST 0.01 % OP SOLN
1.0000 [drp] | Freq: Every day | OPHTHALMIC | Status: DC
  Administered 2012-10-03 – 2012-10-05 (×3): 1 [drp] via OPHTHALMIC
  Filled 2012-10-03: qty 2.5

## 2012-10-03 MED ORDER — LACTATED RINGERS IV SOLN
INTRAVENOUS | Status: DC | PRN
  Administered 2012-10-03 (×3): via INTRAVENOUS

## 2012-10-03 MED ORDER — CEFAZOLIN SODIUM-DEXTROSE 2-3 GM-% IV SOLR
2.0000 g | INTRAVENOUS | Status: AC
  Administered 2012-10-03: 2 g via INTRAVENOUS

## 2012-10-03 MED ORDER — PROPOFOL 10 MG/ML IV BOLUS
INTRAVENOUS | Status: DC | PRN
  Administered 2012-10-03: 180 mg via INTRAVENOUS

## 2012-10-03 MED ORDER — INDIGOTINDISULFONATE SODIUM 8 MG/ML IJ SOLN
INTRAMUSCULAR | Status: DC | PRN
  Administered 2012-10-03: 5 mL via INTRAVENOUS

## 2012-10-03 MED ORDER — ACETAMINOPHEN 10 MG/ML IV SOLN: 1000.0000 mg | Freq: Once | INTRAVENOUS | Status: DC | PRN

## 2012-10-03 MED ORDER — EPHEDRINE SULFATE 50 MG/ML IJ SOLN
INTRAMUSCULAR | Status: DC | PRN
  Administered 2012-10-03: 10 mg via INTRAVENOUS

## 2012-10-03 MED ORDER — ACETAMINOPHEN 10 MG/ML IV SOLN
INTRAVENOUS | Status: DC | PRN
  Administered 2012-10-03: 1000 mg via INTRAVENOUS

## 2012-10-03 MED ORDER — 0.9 % SODIUM CHLORIDE (POUR BTL) OPTIME
TOPICAL | Status: DC | PRN
  Administered 2012-10-03: 5000 mL

## 2012-10-03 MED ORDER — ASPIRIN 81 MG PO TABS: 81.0000 mg | ORAL_TABLET | ORAL | Status: DC

## 2012-10-03 MED ORDER — SUCCINYLCHOLINE CHLORIDE 20 MG/ML IJ SOLN
INTRAMUSCULAR | Status: DC | PRN
  Administered 2012-10-03: 100 mg via INTRAVENOUS

## 2012-10-03 MED ORDER — CEPHALEXIN 500 MG PO CAPS: 500.0000 mg | ORAL_CAPSULE | Freq: Every day | ORAL | Status: DC

## 2012-10-03 MED ORDER — HYOSCYAMINE SULFATE 0.125 MG SL SUBL
0.1250 mg | SUBLINGUAL_TABLET | SUBLINGUAL | Status: DC | PRN
  Administered 2012-10-03: 0.125 mg via ORAL
  Filled 2012-10-03: qty 1

## 2012-10-03 MED ORDER — DIPHENHYDRAMINE HCL 50 MG/ML IJ SOLN: 12.5000 mg | Freq: Four times a day (QID) | INTRAMUSCULAR | Status: DC | PRN

## 2012-10-03 MED ORDER — MIDAZOLAM HCL 5 MG/5ML IJ SOLN
INTRAMUSCULAR | Status: DC | PRN
  Administered 2012-10-03: 2 mg via INTRAVENOUS

## 2012-10-03 MED ORDER — HYDROMORPHONE HCL PF 1 MG/ML IJ SOLN
INTRAMUSCULAR | Status: DC | PRN
  Administered 2012-10-03 (×2): 0.5 mg via INTRAVENOUS
  Administered 2012-10-03: 1 mg via INTRAVENOUS

## 2012-10-03 MED ORDER — OXYCODONE-ACETAMINOPHEN 5-325 MG PO TABS
1.0000 | ORAL_TABLET | ORAL | Status: DC | PRN
  Administered 2012-10-03: 1 via ORAL
  Administered 2012-10-04: 2 via ORAL
  Administered 2012-10-04 – 2012-10-06 (×9): 1 via ORAL
  Filled 2012-10-03 (×5): qty 1
  Filled 2012-10-03: qty 2
  Filled 2012-10-03 (×5): qty 1

## 2012-10-03 MED ORDER — OXYCODONE HCL 5 MG/5ML PO SOLN
5.0000 mg | Freq: Once | ORAL | Status: DC | PRN
  Filled 2012-10-03: qty 5

## 2012-10-03 MED ORDER — DOCUSATE SODIUM 100 MG PO CAPS
100.0000 mg | ORAL_CAPSULE | Freq: Two times a day (BID) | ORAL | Status: DC
  Administered 2012-10-03 – 2012-10-06 (×6): 100 mg via ORAL
  Filled 2012-10-03 (×7): qty 1

## 2012-10-03 SURGICAL SUPPLY — 51 items
APL ESCP 34 STRL LF DISP (HEMOSTASIS) ×1
APPLICATOR SURGIFLO ENDO (HEMOSTASIS) ×1 IMPLANT
BAG URINE DRAINAGE (UROLOGICAL SUPPLIES) ×2 IMPLANT
BLADE EXTENDED COATED 6.5IN (ELECTRODE) ×2 IMPLANT
BLADE HEX COATED 2.75 (ELECTRODE) ×1 IMPLANT
CATH FOLEY 2WAY SLVR  5CC 22FR (CATHETERS)
CATH FOLEY 2WAY SLVR 30CC 24FR (CATHETERS) ×1 IMPLANT
CATH FOLEY 2WAY SLVR 5CC 22FR (CATHETERS) ×1 IMPLANT
CATH FOLEY 3WAY 30CC 24FR (CATHETERS) ×2
CATH URTH STD 24FR FL 3W 2 (CATHETERS) IMPLANT
CLIP LIGATING HEM O LOK PURPLE (MISCELLANEOUS) ×5 IMPLANT
CLIP LIGATING HEMO O LOK GREEN (MISCELLANEOUS) ×5 IMPLANT
CLOTH BEACON ORANGE TIMEOUT ST (SAFETY) ×2 IMPLANT
COVER SURGICAL LIGHT HANDLE (MISCELLANEOUS) ×2 IMPLANT
DISSECTOR ROUND CHERRY 3/8 STR (MISCELLANEOUS) ×1 IMPLANT
DRAIN CHANNEL 10F 3/8 F FF (DRAIN) ×1 IMPLANT
DRAPE LAPAROTOMY T 102X78X121 (DRAPES) ×2 IMPLANT
DRAPE WARM FLUID 44X44 (DRAPE) ×2 IMPLANT
ELECT REM PT RETURN 9FT ADLT (ELECTROSURGICAL) ×2
ELECTRODE REM PT RTRN 9FT ADLT (ELECTROSURGICAL) ×1 IMPLANT
EVACUATOR SILICONE 100CC (DRAIN) ×1 IMPLANT
GAUZE SPONGE 4X4 16PLY XRAY LF (GAUZE/BANDAGES/DRESSINGS) ×2 IMPLANT
GLOVE BIOGEL M STRL SZ7.5 (GLOVE) ×4 IMPLANT
GOWN PREVENTION PLUS XLARGE (GOWN DISPOSABLE) ×2 IMPLANT
GOWN STRL REIN XL XLG (GOWN DISPOSABLE) ×2 IMPLANT
KIT BASIN OR (CUSTOM PROCEDURE TRAY) ×2 IMPLANT
LUBRICANT JELLY K Y 4OZ (MISCELLANEOUS) ×2 IMPLANT
NEEDLE MAYO .5 CIRCLE (NEEDLE) IMPLANT
NS IRRIG 1000ML POUR BTL (IV SOLUTION) ×4 IMPLANT
PACK GENERAL/GYN (CUSTOM PROCEDURE TRAY) ×2 IMPLANT
PLUG CATH AND CAP STER (CATHETERS) ×2 IMPLANT
SET IRRIG Y TYPE TUR BLADDER L (SET/KITS/TRAYS/PACK) ×1 IMPLANT
SPONGE GAUZE 4X4 12PLY (GAUZE/BANDAGES/DRESSINGS) ×1 IMPLANT
SPONGE LAP 18X18 X RAY DECT (DISPOSABLE) ×2 IMPLANT
SPONGE LAP 4X18 X RAY DECT (DISPOSABLE) ×1 IMPLANT
STAPLER VISISTAT 35W (STAPLE) ×1 IMPLANT
SURGIFLO W/THROMBIN 8M KIT (HEMOSTASIS) ×1 IMPLANT
SUT ETHILON 4 0 PS 2 18 (SUTURE) ×1 IMPLANT
SUT PDS AB 1 CTX 36 (SUTURE) ×2 IMPLANT
SUT PDS AB 1 TP1 96 (SUTURE) ×2 IMPLANT
SUT PDS AB 4-0 RB1 27 (SUTURE) ×8 IMPLANT
SUT VIC AB 0 CT1 27 (SUTURE) ×8
SUT VIC AB 0 CT1 27XBRD ANTBC (SUTURE) ×4 IMPLANT
SUT VIC AB 1 BRD 54 (SUTURE) ×2 IMPLANT
SUT VIC AB 1 CTX 36 (SUTURE)
SUT VIC AB 1 CTX36XBRD ANBCTR (SUTURE) IMPLANT
SUT VIC AB 2-0 UR5 27 (SUTURE) ×12 IMPLANT
SYR 30ML LL (SYRINGE) ×2 IMPLANT
TAPE CLOTH SURG 4X10 WHT LF (GAUZE/BANDAGES/DRESSINGS) ×1 IMPLANT
TOWEL OR 17X26 10 PK STRL BLUE (TOWEL DISPOSABLE) ×2 IMPLANT
WATER STERILE IRR 1500ML POUR (IV SOLUTION) ×1 IMPLANT

## 2012-10-03 NOTE — Progress Notes (Signed)
During a vital sign check, the NT made RN aware that pt's BP was low. Manually assessed BP and found it to be 72/52 with a HR of 53. Pt found to be diaphortetic and pale in color. Pt reported that he did feel weak, but denied any dizziness while lying in bed. CBI infusing great and foley has great output without clots present. Urine is pink in color. MD made aware of these findings and orders received for a NS bolus, stat EKG, and stat labs. Orders placed and carried out. After further assessment JP output was found to be and a large serosanguinous area noted on pt's bed pad and drainage noted from tip of the penis. Will notify MD of these further findings and continue to monitor pt.   Arta Bruce [facility]

## 2012-10-03 NOTE — Care Management Note (Unsigned)
    Page 1 of 1   10/03/2012     12:31:27 PM   CARE MANAGEMENT NOTE 10/03/2012  Patient:  Ryan Meza, Ryan Meza   Account Number:  [id number]  Date Initiated:  10/03/2012  Documentation initiated by:  Lanier Clam  Subjective/Objective Assessment:   ADMITTED W/BPH.     Action/Plan:   FROM HOME   Anticipated DC Date:  10/04/2012   Anticipated DC Plan:  HOME/SELF CARE      DC Planning Services  CM consult      Choice offered to / List presented to:             Status of service:  In process, will continue to follow Medicare Important Message given?   (If response is "NO", the following Medicare IM given date fields will be blank) Date Medicare IM given:   Date Additional Medicare IM given:    Discharge Disposition:    Per UR Regulation:  Reviewed for med. necessity/level of care/duration of stay  If discussed at Long Length of Stay Meetings, dates discussed:    Comments:  10/03/12 Moritz Lever RN,BSN NCM [phone] S/P OPEN SUPRAPUBIC SIMPLE PROSTATECTOMY

## 2012-10-03 NOTE — Progress Notes (Signed)
Nutrition Brief Note  Patient identified on the Malnutrition Screening Tool (MST) Report  Body mass index is 23.56 kg/(m^2). Pt meets criteria for wnl based on current BMI.   Current diet order is clear liquids post-op. Labs and medications reviewed.   Pt admitted for prostatectomy due to BPH.  Wt stable at 173-175 lbs.  Pt did weigh 185 lbs 3 years ago, however little change within the past few months.  No nutrition interventions warranted at this time. If nutrition issues arise, please consult RD. Will follow for diet advancement  Loyce Dys, MS RD LDN Clinical Inpatient Dietitian Pager: [phone] Weekend/After hours pager: [phone]

## 2012-10-03 NOTE — Transfer of Care (Signed)
Immediate Anesthesia Transfer of Care Note  Patient: Ryan Meza  Procedure(s) Performed: Procedure(s) (LRB) with comments: PROSTATECTOMY (N/A) - Open Simple Prostatectomy  Patient Location: PACU  Anesthesia Type:General  Level of Consciousness: awake and alert   Airway & Oxygen Therapy: Patient Spontanous Breathing and Patient connected to face mask oxygen  Post-op Assessment: Report given to PACU RN and Post -op Vital signs reviewed and stable  Post vital signs: Reviewed and stable  Complications: No apparent anesthesia complications

## 2012-10-03 NOTE — Op Note (Signed)
Preoperative diagnosis: BPH, acute urinary retention Postoperative diagnosis: Same  Procedure: Open suprapubic simple prostatectomy  Surgeon: Mena Goes  Assistant: Vernie Ammons  Anesthesia: Gen.  Description of procedure: After consent was obtained patient brought to the operating room. After adequate anesthesia the lower abdomen and genitalia were prepped and draped in the usual sterile fashion. A timeout was performed to confirm the patient and procedure. A lower midline incision was made through the subcutaneous tissue and rectus fascia to expose the bladder and the space of Retzius. A 24 French three-way catheter was placed per urethra and the bladder and the bladder filled. Stay sutures were placed anteriorly on the bladder and a cystotomy was made. The bladder was opened sufficiently to expose the bladder neck and prostate. The peritoneum was not entered. The ureteral orifice these were identified. There was a large bilobed median lobe protruding into the bladder. The mucosa was incised circumferentially around the adenoma starting distal to the trigone. A plane was developed between the adenoma and the prostatic capsule. This was followed posteriorly, laterally and anteriorly down to the apex using blunt dissection. At the apex the prostatic urethra was transected using a pinch action of the two fingertips. Excessive traction was avoided so as not to avulse the urethra and injure the sphincteric mechanism. At this point the prostatic adenoma was removed as one large unit from the prostatic fossa. The prostatic fossa was examined for discrete bleeding sites and two 2-0 vicryl sutures were placed in a figure-of-eight fashion at the 4:00 and 8:00 positions of the prostatic fossa to control some of the arterial blood supply. Surgi-Flo was placed in the prostatic fossa and it was packed with a lap sponge for several minutes. This was removed and using 2-0 Vicryl sutures in a figure-of-eight fashion the  bladder mucosa was advanced into the prostatic fossa at the 5-o'clock and 7-o'clock positions at the prostatovesical junction. The ureteral orifice these again were visualized and noted to have good bilateral reflux. The catheter was advanced into the bladder and the balloon filled with 40 mL. The balloon was pulled down toward the prostatic fossa and the bladder irrigated. The bladder was closed in 2 layers with running 2-0 Vicryl suture. The bladder was filled with 300 ml and noted to be watertight. A JP drain was placed through separate stab incision in the left lower quadrant. This was left in the space of Retzius. The fascia was then closed with a running looped PDS suture. The wound was irrigated and the skin was closed with staples. The drain was secured to the skin. He was cleaned up and a sterile dressing applied. The bladder irrigated normally and was started on CBI. The patient was awakened taken to room in stable condition.  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  Disposition: Patient stable to PACU.

## 2012-10-03 NOTE — Anesthesia Preprocedure Evaluation (Addendum)
Anesthesia Evaluation  Patient identified by MRN, date of birth, ID band Patient awake    Reviewed: Allergy & Precautions, H&P , NPO status , Patient's Chart, lab work & pertinent test results  Airway Mallampati: II TM Distance: <3 FB Neck ROM: Full    Dental No notable dental hx. (+) Dental Advisory Given   Pulmonary neg pulmonary ROS,  breath sounds clear to auscultation  Pulmonary exam normal       Cardiovascular hypertension, Pt. on medications Rhythm:Regular Rate:Normal  Normal nuclear stress in 2009   Neuro/Psych negative neurological ROS  negative psych ROS   GI/Hepatic Neg liver ROS, GERD-  Medicated,  Endo/Other  negative endocrine ROS  Renal/GU negative Renal ROS  negative genitourinary   Musculoskeletal negative musculoskeletal ROS (+)   Abdominal   Peds negative pediatric ROS (+)  Hematology negative hematology ROS (+)   Anesthesia Other Findings   Reproductive/Obstetrics negative OB ROS                          Anesthesia Physical Anesthesia Plan  ASA: II  Anesthesia Plan: General   Post-op Pain Management:    Induction: Intravenous  Airway Management Planned: Oral ETT  Additional Equipment:   Intra-op Plan:   Post-operative Plan: Extubation in OR  Informed Consent: I have reviewed the patients History and Physical, chart, labs and discussed the procedure including the risks, benefits and alternatives for the proposed anesthesia with the patient or authorized representative who has indicated his/her understanding and acceptance.   Dental advisory given  Plan Discussed with: CRNA  Anesthesia Plan Comments:        Anesthesia Quick Evaluation                                  Anesthesia Evaluation  Patient identified by MRN, date of birth, ID band Patient awake    Reviewed: Allergy & Precautions, H&P , NPO status , Patient's Chart, lab work & pertinent  test results  Airway Mallampati: II TM Distance: <3 FB Neck ROM: Full    Dental No notable dental hx.    Pulmonary neg pulmonary ROS,  breath sounds clear to auscultation  Pulmonary exam normal       Cardiovascular hypertension, Pt. on medications Rhythm:Regular Rate:Normal     Neuro/Psych negative neurological ROS  negative psych ROS   GI/Hepatic Neg liver ROS, GERD-  Medicated,  Endo/Other  negative endocrine ROS  Renal/GU negative Renal ROS  negative genitourinary   Musculoskeletal negative musculoskeletal ROS (+)   Abdominal   Peds negative pediatric ROS (+)  Hematology negative hematology ROS (+)   Anesthesia Other Findings   Reproductive/Obstetrics negative OB ROS                           Anesthesia Physical Anesthesia Plan  ASA: II  Anesthesia Plan: General   Post-op Pain Management:    Induction: Intravenous  Airway Management Planned: LMA  Additional Equipment:   Intra-op Plan:   Post-operative Plan:   Informed Consent: I have reviewed the patients History and Physical, chart, labs and discussed the procedure including the risks, benefits and alternatives for the proposed anesthesia with the patient or authorized representative who has indicated his/her understanding and acceptance.   Dental advisory given  Plan Discussed with: CRNA and Surgeon  Anesthesia Plan Comments:  Anesthesia Quick Evaluation

## 2012-10-03 NOTE — Interval H&P Note (Signed)
History and Physical Interval Note:  10/03/2012 7:26 AM  Ryan Meza  has presented today for surgery, with the diagnosis of BPH (benign prostatic hypertrophy) with urinary retention  The various methods of treatment have been discussed with the patient and family. After consideration of risks, benefits and other options for treatment, the patient has consented to  Procedure(s) (LRB) with comments: PROSTATECTOMY (N/A) - Open Simple Prostatectomy as a surgical intervention .  The patient's history has been reviewed, patient examined, no change in status, stable for surgery.  I have reviewed the patient's chart and labs.  Questions were answered to the patient's satisfaction.  We discussed the side effects of the proposed treatment, the likelihood of the patient achieving the goals of the procedure, and any potential problems that might occur during the procedure or recuperation. Patient elects to proceed with open simple prostatectomy.    [name]

## 2012-10-03 NOTE — Progress Notes (Signed)
Patient ID: Ryan Meza, male   DOB: February 14, 1941, 71 y.o.   MRN: [id number]   [name] has had some bradycardia and hypotension this afternoon. He feels like he needs to urinate. He has had no chest pain or shortness of breath.  On exam he is in no acute distress Alert and oriented x3 Abdomen is soft, but the suprapubic/bladder area is distended; JP with minimal fluid GU-Foley catheter in place, patient voiding some around it.  I attempted to hand irrigate the catheter and it would not irrigate. I tried to advance the catheter but it was not mobile. I suspect during transfer the balloon may have been pulled down in the prostatic fossa. Therefore I took 15 cc out of the balloon.now the catheter became mobile and began draining. He quickly and irrigated to light pink with 2 syringe of clot. On exam the bladder was now decompressed. The patient felt much better. The catheter was reconnected to CBI and it was noted to be a running normal with light hematuria.  Nurse took patient's blood pressure again manually and the systolics were 100s. Heart rate in the 60s.  His H&H is stable but his creatinine is slightly elevated to 1.48  Impression-postop open simple suprapubic prostatectomy -Bradycardia, Hypotension - improving, likely due to bladder distention -Slightly elevated creatinine- likely due to hypotension  Plan- -Continue CBI -check CBC and BMP in a.m.

## 2012-10-03 NOTE — Anesthesia Postprocedure Evaluation (Signed)
Anesthesia Post Note  Patient: Ryan Meza  Procedure(s) Performed: Procedure(s) (LRB): PROSTATECTOMY (N/A)  Anesthesia type: General  Patient location: PACU  Post pain: Pain level controlled  Post assessment: Post-op Vital signs reviewed  Last Vitals: BP 116/74  Pulse 70  Temp 36.5 C (Oral)  Resp 14  SpO2 99%  Post vital signs: Reviewed  Level of consciousness: sedated  Complications: No apparent anesthesia complications

## 2012-10-04 LAB — BASIC METABOLIC PANEL
CO2: 24 mEq/L (ref 19–32)
Chloride: 101 mEq/L (ref 96–112)
GFR calc non Af Amer: 44 mL/min — ABNORMAL LOW (ref >90)
Glucose, Bld: 162 mg/dL — ABNORMAL HIGH (ref 70–99)
Potassium: 5.2 mEq/L — ABNORMAL HIGH (ref 3.5–5.1)
Sodium: 133 mEq/L — ABNORMAL LOW (ref 135–145)

## 2012-10-04 NOTE — Progress Notes (Signed)
States he feels better today.  A bit of soreness in the groin area.

## 2012-10-04 NOTE — Progress Notes (Addendum)
Wrong pt

## 2012-10-04 NOTE — Progress Notes (Signed)
Attempted to ambulate patient.  Upon standing complaining of dizziness, feeling warm and increase in heart rate.  Returned to bed and check blood pressure.

## 2012-10-04 NOTE — Progress Notes (Signed)
Provided exitcare sheet on post op home care

## 2012-10-04 NOTE — Progress Notes (Signed)
Patient ID: Ryan Meza, male   DOB: July 04, 1941, 71 y.o.   MRN: [id number] 1 Day Post-Op Subjective: Patient reports having a good night. Pain is well-controlled. Urine is clear on very slow continuous bladder irrigation at this time. Nursing staff has no concerns.  Objective: Vital signs in last 24 hours: Temp:  [97.3 F (36.3 C)-98.4 F (36.9 C)] 98.4 F (36.9 C) (12/07 0605) Pulse Rate:  [52-88] 68  (12/07 0605) Resp:  [9-19] 18  (12/07 0245) BP: (72-133)/(46-92) 97/54 mmHg (12/07 0605) SpO2:  [95 %-100 %] 100 % (12/07 0605) Weight:  [78.8 kg (173 lb 11.6 oz)] 78.8 kg (173 lb 11.6 oz) (12/06 1135)  Intake/Output from previous day: 12/06 0701 - 12/07 0700 In: 40981.1 [P.O.:960; I.V.:3943.8; IV Piggyback:1600] Out: 91478 [GNFAO:13086; Drains:17; Blood:1200] Intake/Output this shift: Total I/O In: 3000 [Other:3000] Out: 2500 [Urine:2500]  Physical Exam:  Constitutional: Vital signs reviewed. WD WN in NAD   Eyes: PERRL, No scleral icterus.   Cardiovascular: RRR Pulmonary/Chest: Normal effort Abdominal: Soft. Non-tender, non-distended, bowel sounds are normal, no masses, organomegaly, or guarding present.  Genitourinary: Catheter draining clear urine. External genitalia within normal limits. Extremities: No cyanosis or edema   Lab Results:  Basename 10/04/12 0322 10/03/12 1455 10/03/12 1020  HGB 8.0* 9.2* 9.4*  HCT 23.6* 27.1* 27.7*   BMET  Basename 10/04/12 0322 10/03/12 1455  NA 133* 138  K 5.2* 4.9  CL 101 106  CO2 24 25  GLUCOSE 162* 178*  BUN 24* 19  CREATININE 1.50* 1.48*  CALCIUM 8.2* 8.5   No results found for this basename: LABPT:3,INR:3 in the last 72 hours No results found for this basename: LABURIN:1 in the last 72 hours Results for orders placed during the hospital encounter of 09/24/12  SURGICAL PCR SCREEN     Status: Normal   Collection Time   09/24/12 10:19 AM      Component Value Range Status Comment   MRSA, PCR NEGATIVE  NEGATIVE Final     Staphylococcus aureus NEGATIVE  NEGATIVE Final     Studies/Results: No results found.  Assessment/Plan:   Doing well postoperative day #1. Will continue to observe. Advanced diet. Out of bed to chair with some ambulation today.  Possible discharge tomorrow morning if urine remains relatively clear. The patient will go home with a catheter.   LOS: 1 day   Sandrina Heaton S 10/04/2012, 9:27 AM     1 Day Post-Op Subjective: Patient reports   Objective: Vital signs in last 24 hours: Temp:  [97.3 F (36.3 C)-98.4 F (36.9 C)] 98.4 F (36.9 C) (12/07 0605) Pulse Rate:  [52-88] 68  (12/07 0605) Resp:  [9-19] 18  (12/07 0245) BP: (72-133)/(46-92) 97/54 mmHg (12/07 0605) SpO2:  [95 %-100 %] 100 % (12/07 0605) Weight:  [78.8 kg (173 lb 11.6 oz)] 78.8 kg (173 lb 11.6 oz) (12/06 1135)  Intake/Output from previous day: 12/06 0701 - 12/07 0700 In: 57846.9 [P.O.:960; I.V.:3943.8; IV Piggyback:1600] Out: 62952 [WUXLK:44010; Drains:17; Blood:1200] Intake/Output this shift: Total I/O In: 3000 [Other:3000] Out: 2500 [Urine:2500]  Physical Exam:  Constitutional: Vital signs reviewed. WD WN in NAD   Eyes: PERRL, No scleral icterus.   Cardiovascular: RRR Pulmonary/Chest: Normal effort Abdominal: Soft. Non-tender, non-distended, bowel sounds are normal, no masses, organomegaly, or guarding present.  Genitourinary: Extremities: No cyanosis or edema   Lab Results:  Basename 10/04/12 0322 10/03/12 1455 10/03/12 1020  HGB 8.0* 9.2* 9.4*  HCT 23.6* 27.1* 27.7*   BMET  Basename 10/04/12 0322 10/03/12  1455  NA 133* 138  K 5.2* 4.9  CL 101 106  CO2 24 25  GLUCOSE 162* 178*  BUN 24* 19  CREATININE 1.50* 1.48*  CALCIUM 8.2* 8.5   No results found for this basename: LABPT:3,INR:3 in the last 72 hours No results found for this basename: LABURIN:1 in the last 72 hours Results for orders placed during the hospital encounter of 09/24/12  SURGICAL PCR SCREEN     Status: Normal    Collection Time   09/24/12 10:19 AM      Component Value Range Status Comment   MRSA, PCR NEGATIVE  NEGATIVE Final    Staphylococcus aureus NEGATIVE  NEGATIVE Final     Studies/Results: No results found.  Assessment/Plan:   Doing well postoperative day 1. We'll recheck hemoglobin tomorrow. Patient will have an advancement of his diet/out of bed with ambulation. Possible discharge tomorrow.   LOS: 1 day   Council Munguia S 10/04/2012, 9:27 AM

## 2012-10-05 LAB — PREPARE RBC (CROSSMATCH)

## 2012-10-05 LAB — CBC
HCT: 19.2 % — ABNORMAL LOW (ref 39.0–52.0)
Hemoglobin: 6.6 g/dL — CL (ref 13.0–17.0)
MCH: 30.1 pg (ref 26.0–34.0)
MCV: 87.7 fL (ref 78.0–100.0)
RBC: 2.19 MIL/uL — ABNORMAL LOW (ref 4.22–5.81)

## 2012-10-05 NOTE — Progress Notes (Signed)
Patient ID: Ryan Meza, male   DOB: 1941/02/25, 71 y.o.   MRN: [id number] 2 Days Post-Op Subjective: Patient reports some lightheadedness and general weakness. He had a relatively uneventful day and night. Appetite is only been fair and he's had relatively poor by mouth intake. He has not had any bowel activity. Upon attempting to ambulate he's been getting very lightheaded/orthostatic. Urine has remained clear to very light pink. Hemoglobin however has continued to decline and is now 6.6.  Objective: Vital signs in last 24 hours: Temp:  [98.3 F (36.8 C)-98.9 F (37.2 C)] 98.9 F (37.2 C) (12/08 0521) Pulse Rate:  [65-76] 74  (12/08 0521) BP: (97-115)/(49-51) 115/51 mmHg (12/08 0521) SpO2:  [95 %-100 %] 95 % (12/08 0521)  Intake/Output from previous day: 12/07 0701 - 12/08 0700 In: 26529.5 [P.O.:882; I.V.:1637.5] Out: 56213 [Urine:26325] Intake/Output this shift:    Physical Exam:  Constitutional: Vital signs reviewed. WD WN in NAD   Eyes: PERRL, No scleral icterus.   Cardiovascular: RRR Pulmonary/Chest: Normal effort Abdominal: Soft. Non-tender, non-distended. Genitourinary: Catheter indwelling draining clear to light pink urine. Extremities: No cyanosis or edema   Lab Results:  Basename 10/05/12 0537 10/04/12 0322 10/03/12 1455  HGB 6.6* 8.0* 9.2*  HCT 19.2* 23.6* 27.1*   BMET  Basename 10/04/12 0322 10/03/12 1455  NA 133* 138  K 5.2* 4.9  CL 101 106  CO2 24 25  GLUCOSE 162* 178*  BUN 24* 19  CREATININE 1.50* 1.48*  CALCIUM 8.2* 8.5   No results found for this basename: LABPT:3,INR:3 in the last 72 hours No results found for this basename: LABURIN:1 in the last 72 hours Results for orders placed during the hospital encounter of 09/24/12  SURGICAL PCR SCREEN     Status: Normal   Collection Time   09/24/12 10:19 AM      Component Value Range Status Comment   MRSA, PCR NEGATIVE  NEGATIVE Final    Staphylococcus aureus NEGATIVE  NEGATIVE Final      Studies/Results: No results found.  Assessment/Plan:   Patient is now postoperative day 2 status post open prostatectomy for BPH. He does have some symptomatic acute blood loss anemia. Given his hemoglobin level and symptomatic status I do think he would benefit from 2 units of packed red blood cells. We will plan on giving him a transfusion today and then hopefully sending him home with an indwelling catheter tomorrow.   LOS: 2 days   Trishia Cuthrell S 10/05/2012, 7:51 AM     2 Days Post-Op Subjective: Patient reports   Objective: Vital signs in last 24 hours: Temp:  [98.3 F (36.8 C)-98.9 F (37.2 C)] 98.9 F (37.2 C) (12/08 0521) Pulse Rate:  [65-76] 74  (12/08 0521) BP: (97-115)/(49-51) 115/51 mmHg (12/08 0521) SpO2:  [95 %-100 %] 95 % (12/08 0521)  Intake/Output from previous day: 12/07 0701 - 12/08 0700 In: 26529.5 [P.O.:882; I.V.:1637.5] Out: 08657 [Urine:26325] Intake/Output this shift:    Physical Exam:  Constitutional: Vital signs reviewed. WD WN in NAD   Eyes: PERRL, No scleral icterus.   Cardiovascular: RRR Pulmonary/Chest: Normal effort Abdominal: Soft. Non-tender, non-distended, bowel sounds are normal, no masses, organomegaly, or guarding present.  Genitourinary: Extremities: No cyanosis or edema   Lab Results:  Basename 10/05/12 0537 10/04/12 0322 10/03/12 1455  HGB 6.6* 8.0* 9.2*  HCT 19.2* 23.6* 27.1*   BMET  Basename 10/04/12 0322 10/03/12 1455  NA 133* 138  K 5.2* 4.9  CL 101 106  CO2 24 25  GLUCOSE 162* 178*  BUN 24* 19  CREATININE 1.50* 1.48*  CALCIUM 8.2* 8.5   No results found for this basename: LABPT:3,INR:3 in the last 72 hours No results found for this basename: LABURIN:1 in the last 72 hours Results for orders placed during the hospital encounter of 09/24/12  SURGICAL PCR SCREEN     Status: Normal   Collection Time   09/24/12 10:19 AM      Component Value Range Status Comment   MRSA, PCR NEGATIVE  NEGATIVE Final     Staphylococcus aureus NEGATIVE  NEGATIVE Final     Studies/Results: No results found.  Assessment/Plan:   Patient is now postoperative day 2 status post open prostatectomy for BPH. The patient does have symptomatic acute blood loss anemia. Given his current hemoglobin level and symptomatic status I do think he would benefit from transfusion of 2 units of packed blood cells. We will recheck hemoglobin levels tomorrow morning and hopefully plan for discharge at that time assuming that things are stable.   LOS: 2 days   Jahara Dail S 10/05/2012, 7:51 AM

## 2012-10-05 NOTE — Progress Notes (Signed)
CRITICAL VALUE ALERT  Critical value received: Hgb-6.6  Date of notification:  10/05/2012 Time of notification:  0609  Critical value read back:yes  Nurse who received alert:  Roosvelt Maser  MD notified (1st page):  Dr. Isabel Caprice  Time of first page:  0615  MD notified (2nd page):  Time of second page:  Responding MD:  Dr. Isabel Caprice  Time MD responded:  [phone]

## 2012-10-05 NOTE — Plan of Care (Signed)
Problem: Phase I Progression Outcomes Goal: Walk in halls when awake from anesthesia Outcome: Not Met (add Reason) Waiting not feeling up to it yet, hemoglobin low

## 2012-10-06 ENCOUNTER — Encounter (HOSPITAL_COMMUNITY): Payer: Self-pay | Admitting: Urology

## 2012-10-06 LAB — TYPE AND SCREEN: Antibody Screen: NEGATIVE

## 2012-10-06 MED ORDER — CIPROFLOXACIN HCL 500 MG PO TABS
500.0000 mg | ORAL_TABLET | Freq: Every day | ORAL | Status: DC
  Administered 2012-10-06: 500 mg via ORAL
  Filled 2012-10-06 (×2): qty 1

## 2012-10-06 NOTE — Discharge Summary (Signed)
Physician Discharge Summary  Patient ID: Ryan Meza MRN: [id number] DOB/AGE: Sep 25, 1941 71 y.o.  Admit date: 10/03/2012 Discharge date: 10/06/2012  Admission Diagnoses: BPH, acute urinary retention  Discharge Diagnoses:  Same  Discharged Condition: good  Hospital Course: patient was admitted following suprapubic open simple prostatectomy. He initially had some hypotension and low blood pressure likely due to acute blood loss anemia. His hemoglobin drifted down to 6.6 and he was transfused 2 units. By postoperative day 3 he was feeling better, ambulating without difficulty and tolerating a regular diet. His urine remained clear off CBI. He was discharged to home with the Foley catheter.  Consults: None  Significant Diagnostic Studies: none  Treatments: #1 surgery: suprapubic open simple prostatectomy #2 transfusion 2 units packed red blood cells  Discharge Exam: Blood pressure 123/60, pulse 75, temperature 98.2 F (36.8 C), temperature source Oral, resp. rate 16, height 6' (1.829 m), weight 78.8 kg (173 lb 11.6 oz), SpO2 98.00%. Patient is in no acute distress Alert and oriented x3 Abdomen soft and nontender Foley catheter urine clear Extremities without edema or swelling  Disposition: 01-Home or Self Care     Medication List     As of 10/06/2012  4:59 PM    TAKE these medications         acetaminophen 500 MG tablet   Commonly known as: TYLENOL   Take 500 mg by mouth every 6 (six) hours as needed. For pain      aspirin 81 MG tablet   Take 1 tablet (81 mg total) by mouth every other day.   Start taking on: 10/08/2012      atorvastatin 80 MG tablet   Commonly known as: LIPITOR   Take 40-80 mg by mouth every evening. take 80mg  on mon, wed, fri, and 40 mg on all other days      bimatoprost 0.03 % ophthalmic solution   Commonly known as: LUMIGAN   Place 1 drop into the right eye at bedtime.      cephALEXin 500 MG capsule   Commonly known as: KEFLEX   Take 1  capsule (500 mg total) by mouth daily.      cholecalciferol 1000 UNITS tablet   Commonly known as: VITAMIN D   Take 1,000 Units by mouth daily.      COMBIGAN 0.2-0.5 % ophthalmic solution   Generic drug: brimonidine-timolol   Place 1 drop into the right eye 2 (two) times daily.      docusate sodium 100 MG capsule   Commonly known as: COLACE   Take 1 capsule (100 mg total) by mouth 2 (two) times daily.      eucerin cream   Apply topically 3 (three) times daily. 1 % 3 x day to abdomen      finasteride 5 MG tablet   Commonly known as: PROSCAR   Take 5 mg by mouth every morning.      lisinopril 5 MG tablet   Commonly known as: PRINIVIL,ZESTRIL   Take 5 mg by mouth at bedtime.      multivitamins ther. w/minerals Tabs   Take 1 tablet by mouth daily.      omeprazole 20 MG capsule   Commonly known as: PRILOSEC   Take 20 mg by mouth every morning.      oxyCODONE-acetaminophen 5-325 MG per tablet   Commonly known as: PERCOCET/ROXICET   Take 1 tablet by mouth every 4 (four) hours as needed for pain.      Tamsulosin HCl 0.4 MG  Caps   Commonly known as: FLOMAX   Take 0.4 mg by mouth every morning.           Follow-up Information    Follow up with [name], MD. On 10/24/2012.   Contact information:   [address] [place] [address] [phone]          Signed: [name] 10/06/2012, 4:59 PM

## 2012-10-06 NOTE — Progress Notes (Signed)
Patient discharge to home, wife at bedside, d/c instructions  and follow up appointment done and given to the patient. Foley bag teaching done in the presence of the wife. PIV removed no s/s of swelling or  rednesss noted. Sx site pink with staples, dressing changed, incision intact.Foley Catheter draining pinkish urine , no clots noted , Patient no complaints of discomfort upon discharge.

## 2012-10-06 NOTE — Progress Notes (Signed)
Patient ID: Ryan Meza, male   DOB: [birth year], 71 y.o.   MRN: [id number]   Feeling better today. No complaints. Sat in chair yesterday. Tolerating a regular diet.  AFVSS A&Ox3 Abd - soft, NT JP - minimal Foley - urine clear on slow CBI  Imp -  POD#3 open simple  Plan - Repeat labs pending D/c JP SLIV Clamp CBI Maybe home later today if ambulating.

## 2012-12-13 ENCOUNTER — Other Ambulatory Visit: Payer: Self-pay

## 2013-06-03 ENCOUNTER — Other Ambulatory Visit: Payer: Self-pay

## 2013-09-03 ENCOUNTER — Other Ambulatory Visit: Payer: Self-pay

## 2014-05-07 ENCOUNTER — Ambulatory Visit (INDEPENDENT_AMBULATORY_CARE_PROVIDER_SITE_OTHER): Payer: Medicare Other | Admitting: General Surgery

## 2014-05-07 ENCOUNTER — Encounter (INDEPENDENT_AMBULATORY_CARE_PROVIDER_SITE_OTHER): Payer: Self-pay | Admitting: General Surgery

## 2014-05-07 VITALS — BP 130/84 | HR 70 | Temp 97.4°F | Resp 18 | Ht 72.0 in | Wt 178.2 lb

## 2014-05-07 DIAGNOSIS — K4090 Unilateral inguinal hernia, without obstruction or gangrene, not specified as recurrent: Secondary | ICD-10-CM | POA: Insufficient documentation

## 2014-05-07 NOTE — Patient Instructions (Signed)
You have a medium-sized right inguinal hernia that is reducible. I do not feel any other hernias.  We talked about the natural history of the hernia. You have  decided that you would like to go ahead and have this repaired the summer.  You'll be scheduled for repair of a right inguinal hernia with mesh. You will be able to go home the same day.     Inguinal Hernia, Adult Muscles help keep everything in the body in its proper place. But if a weak spot in the muscles develops, something can poke through. That is called a hernia. When this happens in the lower part of the belly (abdomen), it is called an inguinal hernia. (It takes its name from a part of the body in this region called the inguinal canal.) A weak spot in the wall of muscles lets some fat or part of the small intestine bulge through. An inguinal hernia can develop at any age. Men get them more often than women. CAUSES  In adults, an inguinal hernia develops over time.  It can be triggered by:  Suddenly straining the muscles of the lower abdomen.  Lifting heavy objects.  Straining to have a bowel movement. Difficult bowel movements (constipation) can lead to this.  Constant coughing. This may be caused by smoking or lung disease.  Being overweight.  Being pregnant.  Working at a job that requires long periods of standing or heavy lifting.  Having had an inguinal hernia before. One type can be an emergency situation. It is called a strangulated inguinal hernia. It develops if part of the small intestine slips through the weak spot and cannot get back into the abdomen. The blood supply can be cut off. If that happens, part of the intestine may die. This situation requires emergency surgery. SYMPTOMS  Often, a small inguinal hernia has no symptoms. It is found when a healthcare provider does a physical exam. Larger hernias usually have symptoms.   In adults, symptoms may include:  A lump in the groin. This is easier to  see when the person is standing. It might disappear when lying down.  In men, a lump in the scrotum.  Pain or burning in the groin. This occurs especially when lifting, straining or coughing.  A dull ache or feeling of pressure in the groin.  Signs of a strangulated hernia can include:  A bulge in the groin that becomes very painful and tender to the touch.  A bulge that turns red or purple.  Fever, nausea and vomiting.  Inability to have a bowel movement or to pass gas. DIAGNOSIS  To decide if you have an inguinal hernia, a healthcare provider will probably do a physical examination.  This will include asking questions about any symptoms you have noticed.  The healthcare provider might feel the groin area and ask you to cough. If an inguinal hernia is felt, the healthcare provider may try to slide it back into the abdomen.  Usually no other tests are needed. TREATMENT  Treatments can vary. The size of the hernia makes a difference. Options include:  Watchful waiting. This is often suggested if the hernia is small and you have had no symptoms.  No medical procedure will be done unless symptoms develop.  You will need to watch closely for symptoms. If any occur, contact your healthcare provider right away.  Surgery. This is used if the hernia is larger or you have symptoms.  Open surgery. This is usually an outpatient procedure (  you will not stay overnight in a hospital). An cut (incision) is made through the skin in the groin. The hernia is put back inside the abdomen. The weak area in the muscles is then repaired by herniorrhaphy or hernioplasty. Herniorrhaphy: in this type of surgery, the weak muscles are sewn back together. Hernioplasty: a patch or mesh is used to close the weak area in the abdominal wall.  Laparoscopy. In this procedure, a surgeon makes small incisions. A thin tube with a tiny video camera (called a laparoscope) is put into the abdomen. The surgeon repairs  the hernia with mesh by looking with the video camera and using two long instruments. HOME CARE INSTRUCTIONS   After surgery to repair an inguinal hernia:  You will need to take pain medicine prescribed by your healthcare provider. Follow all directions carefully.  You will need to take care of the wound from the incision.  Your activity will be restricted for awhile. This will probably include no heavy lifting for several weeks. You also should not do anything too active for a few weeks. When you can return to work will depend on the type of job that you have.  During "watchful waiting" periods, you should:  Maintain a healthy weight.  Eat a diet high in fiber (fruits, vegetables and whole grains).  Drink plenty of fluids to avoid constipation. This means drinking enough water and other liquids to keep your urine clear or pale yellow.  Do not lift heavy objects.  Do not stand for long periods of time.  Quit smoking. This should keep you from developing a frequent cough. SEEK MEDICAL CARE IF:   A bulge develops in your groin area.  You feel pain, a burning sensation or pressure in the groin. This might be worse if you are lifting or straining.  You develop a fever of more than 100.5 F (38.1 C). SEEK IMMEDIATE MEDICAL CARE IF:   Pain in the groin increases suddenly.  A bulge in the groin gets bigger suddenly and does not go down.  For men, there is sudden pain in the scrotum. Or, the size of the scrotum increases.  A bulge in the groin area becomes red or purple and is painful to touch.  You have nausea or vomiting that does not go away.  You feel your heart beating much faster than normal.  You cannot have a bowel movement or pass gas.  You develop a fever of more than 102.0 F (38.9 C). Document Released: 03/03/2009 Document Revised: 01/07/2012 Document Reviewed: 03/03/2009 [facility] Patient Information 2015 [employer] [place]. This information is not intended to  replace advice given to you by your health care provider. Make sure you discuss any questions you have with your health care provider.

## 2014-05-07 NOTE — Progress Notes (Signed)
Patient ID: Ryan JacquetRaymond D Meza, male   DOB: 23-May-1941, 73 y.o.   MRN: [id number]  Chief Complaint  Patient presents with  . Hernia    HPI Ryan Meza is a 73 y.o. male.  He is referred by Dr. Waynard EdwardsPerini for evaluation and management of a right inguinal hernia.  The patient gives a one-month history of a painful bulge in his right groin. It is reducible. He feels discomfort just about every day now. It is uncomfortable when walking. Dr. Waynard EdwardsPerini  diagnosed a right inguinal  hernia. No prior history of hernia  Morbidities include former smoker, quit in 1971. 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 Open prostatectomy for benign prostatic hypertrophy. Cataract surgery. Generally healthy. Activity. Plays golf. He walks a lot. Planning beach trip later on this summer. HPI  Past Medical History  Diagnosis Date  . Hyperlipidemia   . Hypertension   . GERD (gastroesophageal reflux disease)   . Benign prostatic hypertrophy   . Foley catheter in place  for last  2 months    Past Surgical History  Procedure Laterality Date  . Prostate biopsy  3-4 yrs ago  . Prostate biopsy    . Cystoscopy  08/26/2012    Procedure: CYSTOSCOPY;  Surgeon: Antony HasteMatthew Ramsey Eskridge, MD;  Location: [facility];  Service: Urology;  Laterality: N/A;  . Eye surgery    . Prostatectomy  10/03/2012    Procedure: PROSTATECTOMY;  Surgeon: Antony HasteMatthew Ramsey Eskridge, MD;  Location: [facility];  Service: Urology;  Laterality: N/A;  Open Simple Prostatectomy    Family History  Problem Relation Age of Onset  . Coronary artery disease Father     possible ?  Marland Kitchen. Heart attack Father     of possible ?  Marland Kitchen. Hiatal hernia Brother     Social History History  Substance Use Topics  . Smoking status: Former Smoker -- 1.00 packs/day for 15 years    Types: Cigarettes    Quit date: 10/29/1969  . Smokeless tobacco: Never Used  . Alcohol Use: No    No Known Allergies  Current Outpatient Prescriptions  Medication  Sig Dispense Refill  . acetaminophen (TYLENOL) 500 MG tablet Take 500 mg by mouth every 6 (six) hours as needed. For pain      . aspirin 81 MG tablet Take 1 tablet (81 mg total) by mouth every other day.  30 tablet    . atorvastatin (LIPITOR) 80 MG tablet Take 40-80 mg by mouth every evening. take 80mg  on mon, wed, fri, and 40 mg on all other days      . bimatoprost (LUMIGAN) 0.03 % ophthalmic solution Place 1 drop into the right eye at bedtime.       . brimonidine-timolol (COMBIGAN) 0.2-0.5 % ophthalmic solution Place 1 drop into the right eye 2 (two) times daily.       . cephALEXin (KEFLEX) 500 MG capsule Take 1 capsule (500 mg total) by mouth daily.  21 capsule  0  . cholecalciferol (VITAMIN D) 1000 UNITS tablet Take 1,000 Units by mouth daily.      Marland Kitchen. docusate sodium (COLACE) 100 MG capsule Take 1 capsule (100 mg total) by mouth 2 (two) times daily.  60 capsule  0  . finasteride (PROSCAR) 5 MG tablet Take 5 mg by mouth every morning.       Marland Kitchen. lisinopril (PRINIVIL,ZESTRIL) 5 MG tablet Take 5 mg by mouth at bedtime.      . Multiple Vitamins-Minerals (MULTIVITAMINS THER. W/MINERALS)  TABS Take 1 tablet by mouth daily.      Marland Kitchen omeprazole (PRILOSEC) 20 MG capsule Take 20 mg by mouth every morning.       Marland Kitchen oxyCODONE-acetaminophen (ROXICET) 5-325 MG per tablet Take 1 tablet by mouth every 4 (four) hours as needed for pain.  30 tablet  0  . Skin Protectants, Misc. (EUCERIN) cream Apply topically 3 (three) times daily. 1 % 3 x day to abdomen      . Tamsulosin HCl (FLOMAX) 0.4 MG CAPS Take 0.4 mg by mouth every morning.        No current facility-administered medications for this visit.    Review of Systems Review of Systems  Constitutional: Negative for fever, chills and unexpected weight change.  HENT: Negative for congestion, hearing loss, sore throat, trouble swallowing and voice change.   Eyes: Negative for visual disturbance.  Respiratory: Negative for cough and wheezing.   Cardiovascular:  Negative for chest pain, palpitations and leg swelling.  Gastrointestinal: Negative for nausea, vomiting, abdominal pain, diarrhea, constipation, blood in stool, abdominal distention, anal bleeding and rectal pain.  Genitourinary: Negative for hematuria and difficulty urinating.  Musculoskeletal: Negative for arthralgias.  Skin: Negative for rash and wound.  Neurological: Negative for seizures, syncope, weakness and headaches.  Hematological: Negative for adenopathy. Does not bruise/bleed easily.  Psychiatric/Behavioral: Negative for confusion.    Blood pressure 130/84, pulse 70, temperature 97.4 F (36.3 C), temperature source Temporal, resp. rate 18, height 6' (1.829 m), weight 178 lb 3.2 oz (80.831 kg).  Physical Exam Physical Exam  Constitutional: He is oriented to person, place, and time. He appears well-developed and well-nourished. No distress.  HENT:  Head: Normocephalic.  Nose: Nose normal.  Mouth/Throat: No oropharyngeal exudate.  Eyes: Conjunctivae and EOM are normal. Pupils are equal, round, and reactive to light. Right eye exhibits no discharge. Left eye exhibits no discharge. No scleral icterus.  Neck: Normal range of motion. Neck supple. No JVD present. No tracheal deviation present. No thyromegaly present.  Cardiovascular: Normal rate, regular rhythm, normal heart sounds and intact distal pulses.   No murmur heard. Pulmonary/Chest: Effort normal and breath sounds normal. No stridor. No respiratory distress. He has no wheezes. He has no rales. He exhibits no tenderness.  Abdominal: Soft. Bowel sounds are normal. He exhibits no distension and no mass. There is no tenderness. There is no rebound and no guarding.  Lower midline scar starting just below umbilicus to pubis. Appears well healed. No umbilical hernia  Genitourinary:  Medium-sized right inguinal hernia, feels inguinal canal but does not extend into the scrotum. Reducible. Left inguinal exam is basically normal.  There might be a slight bulge but no hernia. No scrotal mass. Testes feel normal.  Musculoskeletal: Normal range of motion. He exhibits no edema and no tenderness.  Lymphadenopathy:    He has no cervical adenopathy.  Neurological: He is alert and oriented to person, place, and time. He has normal reflexes. Coordination normal.  Skin: Skin is warm and dry. No rash noted. He is not diaphoretic. No erythema. No pallor.  Psychiatric: He has a normal mood and affect. His behavior is normal. Judgment and thought content normal.    Data Reviewed Dr. Laurey Morale office notes  Assessment    Symptomatic right inguinal hernia. Patient states that he would prefer to have this repaired now rather than wait.  Remote history of smoking  Status post open prostatectomy for benign disease  Degenerative joint disease     Plan  Schedule for open repair right inguinal hernia with mesh  I discussed the indications, details techniques, and numerous risks of the surgery with him. He is aware of the risk of bleeding, infection, recurrence, nerve damage chronic pain, injury to adjacent organs, and other symptoms. He understands these issues well. All his questions were answered. He agrees with this plan.        Angelia Mould. Derrell Lolling, M.D., [facility] General and Minimally invasive Surgery Breast and Colorectal Surgery Office:   [phone] Pager:   [phone]  05/07/2014, 2:27 PM

## 2014-05-28 ENCOUNTER — Encounter (HOSPITAL_BASED_OUTPATIENT_CLINIC_OR_DEPARTMENT_OTHER): Payer: Self-pay | Admitting: *Deleted

## 2014-05-28 NOTE — Progress Notes (Signed)
05/28/14 1542  OBSTRUCTIVE SLEEP APNEA  Have you ever been diagnosed with sleep apnea through a sleep study? No  Do you snore loudly (loud enough to be heard through closed doors)?  0  Do you often feel tired, fatigued, or sleepy during the daytime? 0  Has anyone observed you stop breathing during your sleep? 0  Do you have, or are you being treated for high blood pressure? 1  BMI more than 35 kg/m2? 0  Age over 75 years old? 1  Neck circumference greater than 40 cm/16 inches? 1  Gender: 1  Obstructive Sleep Apnea Score 4  Score 4 or greater  Results sent to PCP

## 2014-05-28 NOTE — Progress Notes (Signed)
Pt will come in for bmet-ekg-no cardiac or resp problems-had a stress test 09-was normal

## 2014-06-01 ENCOUNTER — Other Ambulatory Visit: Payer: Self-pay

## 2014-06-01 ENCOUNTER — Encounter (HOSPITAL_BASED_OUTPATIENT_CLINIC_OR_DEPARTMENT_OTHER)
Admission: RE | Admit: 2014-06-01 | Discharge: 2014-06-01 | Disposition: A | Discharge status: Home / Self Care | Payer: Medicare Other | Source: Ambulatory Visit | Attending: General Surgery | Admitting: General Surgery

## 2014-06-01 DIAGNOSIS — Z87891 Personal history of nicotine dependence: Secondary | ICD-10-CM | POA: Diagnosis not present

## 2014-06-01 DIAGNOSIS — E785 Hyperlipidemia, unspecified: Secondary | ICD-10-CM | POA: Diagnosis not present

## 2014-06-01 DIAGNOSIS — Z7982 Long term (current) use of aspirin: Secondary | ICD-10-CM | POA: Diagnosis not present

## 2014-06-01 DIAGNOSIS — Z79899 Other long term (current) drug therapy: Secondary | ICD-10-CM | POA: Diagnosis not present

## 2014-06-01 DIAGNOSIS — K4090 Unilateral inguinal hernia, without obstruction or gangrene, not specified as recurrent: Secondary | ICD-10-CM | POA: Diagnosis not present

## 2014-06-01 DIAGNOSIS — N400 Enlarged prostate without lower urinary tract symptoms: Secondary | ICD-10-CM | POA: Diagnosis not present

## 2014-06-01 DIAGNOSIS — K219 Gastro-esophageal reflux disease without esophagitis: Secondary | ICD-10-CM | POA: Diagnosis not present

## 2014-06-01 DIAGNOSIS — I10 Essential (primary) hypertension: Secondary | ICD-10-CM | POA: Diagnosis not present

## 2014-06-01 LAB — BASIC METABOLIC PANEL
Anion gap: 14 (ref 5–15)
BUN: 14 mg/dL (ref 6–23)
CHLORIDE: 103 meq/L (ref 96–112)
CO2: 23 meq/L (ref 19–32)
Calcium: 9.5 mg/dL (ref 8.4–10.5)
Creatinine, Ser: 1.09 mg/dL (ref 0.50–1.35)
GFR calc Af Amer: 75 mL/min — ABNORMAL LOW (ref >90)
GFR calc non Af Amer: 65 mL/min — ABNORMAL LOW (ref >90)
GLUCOSE: 141 mg/dL — AB (ref 70–99)
POTASSIUM: 4.7 meq/L (ref 3.7–5.3)
SODIUM: 140 meq/L (ref 137–147)

## 2014-06-02 NOTE — H&P (Signed)
Ryan Meza   MRN:  [id number]   Description: 73 year old male  Provider: Ernestene Mention, MD  Department: [facility]         Diagnoses      Right inguinal hernia    -  Primary      550.90           Current Vitals Most recent update: 05/07/2014  1:50 PM by Wilder Glade, MA      BP Pulse Temp(Src) Resp Ht Wt      130/84 70 97.4 F (36.3 C) (Temporal) 18 6' (1.829 m) 178 lb 3.2 oz (80.831 kg)      BMI - 24.16 kg/m2                   History and Physical   Ernestene Mention, MD      Status: Signed            Patient ID: Ryan Meza, male   DOB: August 21, 1941, 73 y.o.   MRN: [id number]             HPI Ryan Meza is a 73 y.o. male.  He is referred by Dr. Waynard Edwards for evaluation and management of a right inguinal hernia.   The patient gives a one-month history of a painful bulge in his right groin. It is reducible. He feels discomfort just about every day now. It is uncomfortable when walking. Dr. Waynard Edwards  diagnosed a right inguinal  hernia. No prior history of hernia   Morbidities include former smoker, quit in 1971. 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 Open prostatectomy for benign prostatic hypertrophy. Cataract surgery. Generally healthy. Activity. Plays golf. He walks a lot. Planning beach trip later on this summer.        Past Medical History   Diagnosis  Date   .  Hyperlipidemia     .  Hypertension     .  GERD (gastroesophageal reflux disease)     .  Benign prostatic hypertrophy     .  Foley catheter in place   for last  2 months         Past Surgical History   Procedure  Laterality  Date   .  Prostate biopsy    3-4 yrs ago   .  Prostate biopsy       .  Cystoscopy    08/26/2012       Procedure: CYSTOSCOPY;  Surgeon: [name], MD;  Location: [facility];  Service: Urology;  Laterality: N/A;   .  Eye surgery       .  Prostatectomy    10/03/2012       Procedure: PROSTATECTOMY;  Surgeon:  [name], MD;  Location: [facility];  Service: Urology;  Laterality: N/A;  Open Simple Prostatectomy         Family History   Problem  Relation  Age of Onset   .  Coronary artery disease  Father         possible ?   Marland Kitchen  Heart attack  Father         of possible ?   Marland Kitchen  Hiatal hernia  Brother          Social History History   Substance Use Topics   .  Smoking status:  Former Smoker -- 1.00 packs/day for 15 years       Types:  Cigarettes  Quit date:  10/29/1969   .  Smokeless tobacco:  Never Used   .  Alcohol Use:  No        No Known Allergies    Current Outpatient Prescriptions   Medication  Sig  Dispense  Refill   .  acetaminophen (TYLENOL) 500 MG tablet  Take 500 mg by mouth every 6 (six) hours as needed. For pain         .  aspirin 81 MG tablet  Take 1 tablet (81 mg total) by mouth every other day.   30 tablet      .  atorvastatin (LIPITOR) 80 MG tablet  Take 40-80 mg by mouth every evening. take 80mg  on mon, wed, fri, and 40 mg on all other days         .  bimatoprost (LUMIGAN) 0.03 % ophthalmic solution  Place 1 drop into the right eye at bedtime.          .  brimonidine-timolol (COMBIGAN) 0.2-0.5 % ophthalmic solution  Place 1 drop into the right eye 2 (two) times daily.          .  cephALEXin (KEFLEX) 500 MG capsule  Take 1 capsule (500 mg total) by mouth daily.   21 capsule   0   .  cholecalciferol (VITAMIN D) 1000 UNITS tablet  Take 1,000 Units by mouth daily.         Marland Kitchen  docusate sodium (COLACE) 100 MG capsule  Take 1 capsule (100 mg total) by mouth 2 (two) times daily.   60 capsule   0   .  finasteride (PROSCAR) 5 MG tablet  Take 5 mg by mouth every morning.          Marland Kitchen  lisinopril (PRINIVIL,ZESTRIL) 5 MG tablet  Take 5 mg by mouth at bedtime.         .  Multiple Vitamins-Minerals (MULTIVITAMINS THER. W/MINERALS) TABS  Take 1 tablet by mouth daily.         Marland Kitchen  omeprazole (PRILOSEC) 20 MG capsule  Take 20 mg by mouth every morning.          Marland Kitchen   oxyCODONE-acetaminophen (ROXICET) 5-325 MG per tablet  Take 1 tablet by mouth every 4 (four) hours as needed for pain.   30 tablet   0   .  Skin Protectants, Misc. (EUCERIN) cream  Apply topically 3 (three) times daily. 1 % 3 x day to abdomen         .  Tamsulosin HCl (FLOMAX) 0.4 MG CAPS  Take 0.4 mg by mouth every morning.              No current facility-administered medications for this visit.        Review of Systems   Constitutional: Negative for fever, chills and unexpected weight change.  HENT: Negative for congestion, hearing loss, sore throat, trouble swallowing and voice change.   Eyes: Negative for visual disturbance.  Respiratory: Negative for cough and wheezing.   Cardiovascular: Negative for chest pain, palpitations and leg swelling.  Gastrointestinal: Negative for nausea, vomiting, abdominal pain, diarrhea, constipation, blood in stool, abdominal distention, anal bleeding and rectal pain.  Genitourinary: Negative for hematuria and difficulty urinating.  Musculoskeletal: Negative for arthralgias.  Skin: Negative for rash and wound.  Neurological: Negative for seizures, syncope, weakness and headaches.  Hematological: Negative for adenopathy. Does not bruise/bleed easily.  Psychiatric/Behavioral: Negative for confusion.      Blood pressure 130/84, pulse 70, temperature  97.4 F (36.3 C), temperature source Temporal, resp. rate 18, height 6' (1.829 m), weight 178 lb 3.2 oz (80.831 kg).   Physical Exam   Constitutional: He is oriented to person, place, and time. He appears well-developed and well-nourished. No distress.  HENT:   Head: Normocephalic.   Nose: Nose normal.   Mouth/Throat: No oropharyngeal exudate.  Eyes: Conjunctivae and EOM are normal. Pupils are equal, round, and reactive to light. Right eye exhibits no discharge. Left eye exhibits no discharge. No scleral icterus.  Neck: Normal range of motion. Neck supple. No JVD present. No tracheal deviation  present. No thyromegaly present.  Cardiovascular: Normal rate, regular rhythm, normal heart sounds and intact distal pulses.    No murmur heard. Pulmonary/Chest: Effort normal and breath sounds normal. No stridor. No respiratory distress. He has no wheezes. He has no rales. He exhibits no tenderness.  Abdominal: Soft. Bowel sounds are normal. He exhibits no distension and no mass. There is no tenderness. There is no rebound and no guarding.  Lower midline scar starting just below umbilicus to pubis. Appears well healed. No umbilical hernia  Genitourinary:  Medium-sized right inguinal hernia, feels inguinal canal but does not extend into the scrotum. Reducible. Left inguinal exam is basically normal. There might be a slight bulge but no hernia. No scrotal mass. Testes feel normal.  Musculoskeletal: Normal range of motion. He exhibits no edema and no tenderness.  Lymphadenopathy:    He has no cervical adenopathy.  Neurological: He is alert and oriented to person, place, and time. He has normal reflexes. Coordination normal.  Skin: Skin is warm and dry. No rash noted. He is not diaphoretic. No erythema. No pallor.  Psychiatric: He has a normal mood and affect. His behavior is normal. Judgment and thought content normal.      Data Reviewed Dr. Laurey MoralePerini's office notes   Assessment    Symptomatic right inguinal hernia. Patient states that he would prefer to have this repaired now rather than wait.   Remote history of smoking   Status post open prostatectomy for benign disease   Degenerative joint disease      Plan    Schedule for open repair right inguinal hernia with mesh   I discussed the indications, details techniques, and numerous risks of the surgery with him. He is aware of the risk of bleeding, infection, recurrence, nerve damage chronic pain, injury to adjacent organs, and other symptoms. He understands these issues well. All his questions were answered. He agrees with this  plan.           Angelia MouldHaywood M. Derrell LollingIngram, M.D., [facility] General and Minimally invasive Surgery Breast and Colorectal Surgery Office:   [phone] Pager:   [phone]

## 2014-06-04 ENCOUNTER — Encounter (HOSPITAL_BASED_OUTPATIENT_CLINIC_OR_DEPARTMENT_OTHER)
Admission: RE | Disposition: A | Discharge status: Home / Self Care | Payer: Self-pay | Source: Ambulatory Visit | Attending: General Surgery

## 2014-06-04 ENCOUNTER — Ambulatory Visit (HOSPITAL_BASED_OUTPATIENT_CLINIC_OR_DEPARTMENT_OTHER): Payer: Medicare Other | Admitting: Anesthesiology

## 2014-06-04 ENCOUNTER — Encounter (HOSPITAL_BASED_OUTPATIENT_CLINIC_OR_DEPARTMENT_OTHER): Payer: Self-pay | Admitting: Anesthesiology

## 2014-06-04 ENCOUNTER — Ambulatory Visit (HOSPITAL_BASED_OUTPATIENT_CLINIC_OR_DEPARTMENT_OTHER)
Admission: RE | Admit: 2014-06-04 | Discharge: 2014-06-04 | Disposition: A | Discharge status: Home / Self Care | Payer: Medicare Other | Source: Ambulatory Visit | Attending: General Surgery | Admitting: General Surgery

## 2014-06-04 ENCOUNTER — Encounter (HOSPITAL_BASED_OUTPATIENT_CLINIC_OR_DEPARTMENT_OTHER): Payer: Medicare Other | Admitting: Anesthesiology

## 2014-06-04 DIAGNOSIS — K219 Gastro-esophageal reflux disease without esophagitis: Secondary | ICD-10-CM | POA: Insufficient documentation

## 2014-06-04 DIAGNOSIS — N400 Enlarged prostate without lower urinary tract symptoms: Secondary | ICD-10-CM | POA: Insufficient documentation

## 2014-06-04 DIAGNOSIS — E785 Hyperlipidemia, unspecified: Secondary | ICD-10-CM | POA: Diagnosis not present

## 2014-06-04 DIAGNOSIS — I10 Essential (primary) hypertension: Secondary | ICD-10-CM | POA: Insufficient documentation

## 2014-06-04 DIAGNOSIS — K4090 Unilateral inguinal hernia, without obstruction or gangrene, not specified as recurrent: Secondary | ICD-10-CM

## 2014-06-04 DIAGNOSIS — Z87891 Personal history of nicotine dependence: Secondary | ICD-10-CM | POA: Insufficient documentation

## 2014-06-04 DIAGNOSIS — Z7982 Long term (current) use of aspirin: Secondary | ICD-10-CM | POA: Insufficient documentation

## 2014-06-04 DIAGNOSIS — Z79899 Other long term (current) drug therapy: Secondary | ICD-10-CM | POA: Insufficient documentation

## 2014-06-04 HISTORY — PX: INSERTION OF MESH: SHX5868

## 2014-06-04 HISTORY — DX: Complete loss of teeth, unspecified cause, unspecified class: Z97.2

## 2014-06-04 HISTORY — DX: Presence of spectacles and contact lenses: Z97.3

## 2014-06-04 HISTORY — DX: Unspecified osteoarthritis, unspecified site: M19.90

## 2014-06-04 HISTORY — PX: INGUINAL HERNIA REPAIR: SHX194

## 2014-06-04 HISTORY — DX: Complete loss of teeth, unspecified cause, unspecified class: K08.109

## 2014-06-04 LAB — POCT HEMOGLOBIN-HEMACUE: HEMOGLOBIN: 14.6 g/dL (ref 13.0–17.0)

## 2014-06-04 SURGERY — REPAIR, HERNIA, INGUINAL, ADULT
Anesthesia: General | Site: Groin | Laterality: Right

## 2014-06-04 MED ORDER — FENTANYL CITRATE 0.05 MG/ML IJ SOLN
INTRAMUSCULAR | Status: AC
  Filled 2014-06-04: qty 2

## 2014-06-04 MED ORDER — DEXAMETHASONE SODIUM PHOSPHATE 4 MG/ML IJ SOLN
INTRAMUSCULAR | Status: DC | PRN
  Administered 2014-06-04: 10 mg via INTRAVENOUS

## 2014-06-04 MED ORDER — FENTANYL CITRATE 0.05 MG/ML IJ SOLN
50.0000 ug | INTRAMUSCULAR | Status: DC | PRN
  Administered 2014-06-04: 100 ug via INTRAVENOUS

## 2014-06-04 MED ORDER — HYDROMORPHONE HCL PF 1 MG/ML IJ SOLN
INTRAMUSCULAR | Status: AC
  Filled 2014-06-04: qty 1

## 2014-06-04 MED ORDER — MIDAZOLAM HCL 2 MG/2ML IJ SOLN
1.0000 mg | INTRAMUSCULAR | Status: DC | PRN
  Administered 2014-06-04: 2 mg via INTRAVENOUS

## 2014-06-04 MED ORDER — CEFAZOLIN SODIUM-DEXTROSE 2-3 GM-% IV SOLR
INTRAVENOUS | Status: DC | PRN
  Administered 2014-06-04: 2 g via INTRAVENOUS

## 2014-06-04 MED ORDER — GLYCOPYRROLATE 0.2 MG/ML IJ SOLN
INTRAMUSCULAR | Status: DC | PRN
  Administered 2014-06-04: 0.2 mg via INTRAVENOUS

## 2014-06-04 MED ORDER — EPHEDRINE SULFATE 50 MG/ML IJ SOLN
INTRAMUSCULAR | Status: DC | PRN
  Administered 2014-06-04: 10 mg via INTRAVENOUS

## 2014-06-04 MED ORDER — MIDAZOLAM HCL 2 MG/2ML IJ SOLN
INTRAMUSCULAR | Status: AC
  Filled 2014-06-04: qty 2

## 2014-06-04 MED ORDER — SCOPOLAMINE 1 MG/3DAYS TD PT72
MEDICATED_PATCH | TRANSDERMAL | Status: AC
  Filled 2014-06-04: qty 1

## 2014-06-04 MED ORDER — FENTANYL CITRATE 0.05 MG/ML IJ SOLN
INTRAMUSCULAR | Status: DC | PRN
  Administered 2014-06-04: 100 ug via INTRAVENOUS

## 2014-06-04 MED ORDER — OXYCODONE-ACETAMINOPHEN 7.5-325 MG PO TABS: 1.0000 | ORAL_TABLET | ORAL | Status: DC | PRN

## 2014-06-04 MED ORDER — ONDANSETRON HCL 4 MG/2ML IJ SOLN: 4.0000 mg | Freq: Once | INTRAMUSCULAR | Status: DC | PRN

## 2014-06-04 MED ORDER — PROPOFOL 10 MG/ML IV BOLUS
INTRAVENOUS | Status: DC | PRN
  Administered 2014-06-04: 180 mg via INTRAVENOUS

## 2014-06-04 MED ORDER — SUCCINYLCHOLINE CHLORIDE 20 MG/ML IJ SOLN
INTRAMUSCULAR | Status: DC | PRN
  Administered 2014-06-04: 100 mg via INTRAVENOUS

## 2014-06-04 MED ORDER — HYDROMORPHONE HCL PF 1 MG/ML IJ SOLN
0.2500 mg | INTRAMUSCULAR | Status: DC | PRN
  Administered 2014-06-04: 0.5 mg via INTRAVENOUS

## 2014-06-04 MED ORDER — BUPIVACAINE-EPINEPHRINE (PF) 0.5% -1:200000 IJ SOLN
INTRAMUSCULAR | Status: DC | PRN
  Administered 2014-06-04: 25 mL via PERINEURAL

## 2014-06-04 MED ORDER — BUPIVACAINE-EPINEPHRINE 0.5% -1:200000 IJ SOLN
INTRAMUSCULAR | Status: DC | PRN
  Administered 2014-06-04: 8 mL

## 2014-06-04 MED ORDER — FENTANYL CITRATE 0.05 MG/ML IJ SOLN
INTRAMUSCULAR | Status: AC
  Filled 2014-06-04: qty 4

## 2014-06-04 MED ORDER — SCOPOLAMINE 1 MG/3DAYS TD PT72
1.0000 | MEDICATED_PATCH | TRANSDERMAL | Status: DC
  Administered 2014-06-04: 1.5 mg via TRANSDERMAL

## 2014-06-04 MED ORDER — OXYCODONE HCL 5 MG PO TABS: 5.0000 mg | ORAL_TABLET | Freq: Once | ORAL | Status: DC | PRN

## 2014-06-04 MED ORDER — LIDOCAINE-EPINEPHRINE (PF) 1 %-1:200000 IJ SOLN
INTRAMUSCULAR | Status: AC
  Filled 2014-06-04: qty 10

## 2014-06-04 MED ORDER — LIDOCAINE HCL (CARDIAC) 10 MG/ML IV SOLN
INTRAVENOUS | Status: DC | PRN
  Administered 2014-06-04: 80 mg via INTRAVENOUS

## 2014-06-04 MED ORDER — BUPIVACAINE LIPOSOME 1.3 % IJ SUSP
INTRAMUSCULAR | Status: AC
  Filled 2014-06-04: qty 20

## 2014-06-04 MED ORDER — PROPOFOL 10 MG/ML IV BOLUS
INTRAVENOUS | Status: AC
  Filled 2014-06-04: qty 20

## 2014-06-04 MED ORDER — OXYCODONE HCL 5 MG/5ML PO SOLN: 5.0000 mg | Freq: Once | ORAL | Status: DC | PRN

## 2014-06-04 MED ORDER — CEFAZOLIN SODIUM-DEXTROSE 2-3 GM-% IV SOLR
INTRAVENOUS | Status: AC
  Filled 2014-06-04: qty 50

## 2014-06-04 MED ORDER — ONDANSETRON HCL 4 MG/2ML IJ SOLN
INTRAMUSCULAR | Status: DC | PRN
  Administered 2014-06-04: 4 mg via INTRAVENOUS

## 2014-06-04 MED ORDER — BUPIVACAINE-EPINEPHRINE (PF) 0.5% -1:200000 IJ SOLN
INTRAMUSCULAR | Status: AC
  Filled 2014-06-04: qty 30

## 2014-06-04 MED ORDER — SODIUM BICARBONATE 4 % IV SOLN
INTRAVENOUS | Status: AC
  Filled 2014-06-04: qty 5

## 2014-06-04 MED ORDER — LACTATED RINGERS IV SOLN
INTRAVENOUS | Status: DC
  Administered 2014-06-04 (×2): via INTRAVENOUS

## 2014-06-04 SURGICAL SUPPLY — 64 items
ADH SKN CLS APL DERMABOND .7 (GAUZE/BANDAGES/DRESSINGS) ×2
APL SKNCLS STERI-STRIP NONHPOA (GAUZE/BANDAGES/DRESSINGS)
BENZOIN TINCTURE PRP APPL 2/3 (GAUZE/BANDAGES/DRESSINGS) IMPLANT
BLADE CLIPPER SURG (BLADE) ×2 IMPLANT
BLADE HEX COATED 2.75 (ELECTRODE) ×4 IMPLANT
BLADE SURG 10 STRL SS (BLADE) ×4 IMPLANT
CANISTER SUCT 1200ML W/VALVE (MISCELLANEOUS) ×4 IMPLANT
CHLORAPREP W/TINT 26ML (MISCELLANEOUS) ×2 IMPLANT
CLOSURE WOUND 1/2 X4 (GAUZE/BANDAGES/DRESSINGS)
COVER MAYO STAND STRL (DRAPES) ×4 IMPLANT
COVER TABLE BACK 60X90 (DRAPES) ×4 IMPLANT
DECANTER SPIKE VIAL GLASS SM (MISCELLANEOUS) IMPLANT
DERMABOND ADVANCED (GAUZE/BANDAGES/DRESSINGS) ×2
DERMABOND ADVANCED .7 DNX12 (GAUZE/BANDAGES/DRESSINGS) IMPLANT
DRAIN PENROSE 1/2X12 LTX STRL (WOUND CARE) ×4 IMPLANT
DRAPE LAPAROTOMY TRNSV 102X78 (DRAPE) IMPLANT
DRAPE PED LAPAROTOMY (DRAPES) ×4 IMPLANT
DRAPE UTILITY XL STRL (DRAPES) ×4 IMPLANT
ELECT REM PT RETURN 9FT ADLT (ELECTROSURGICAL) ×4
ELECTRODE REM PT RTRN 9FT ADLT (ELECTROSURGICAL) ×2 IMPLANT
GLOVE BIOGEL PI IND STRL 6.5 (GLOVE) IMPLANT
GLOVE BIOGEL PI INDICATOR 6.5 (GLOVE) ×2
GLOVE ECLIPSE 6.5 STRL STRAW (GLOVE) ×2 IMPLANT
GLOVE EUDERMIC 7 POWDERFREE (GLOVE) ×4 IMPLANT
GOWN STRL REUS W/ TWL LRG LVL3 (GOWN DISPOSABLE) ×2 IMPLANT
GOWN STRL REUS W/ TWL XL LVL3 (GOWN DISPOSABLE) ×2 IMPLANT
GOWN STRL REUS W/TWL LRG LVL3 (GOWN DISPOSABLE) ×4
GOWN STRL REUS W/TWL XL LVL3 (GOWN DISPOSABLE) ×4
MESH ULTRAPRO 3X6 7.6X15CM (Mesh General) ×2 IMPLANT
NDL HYPO 25X1 1.5 SAFETY (NEEDLE) ×2 IMPLANT
NEEDLE HYPO 22GX1.5 SAFETY (NEEDLE) ×2 IMPLANT
NEEDLE HYPO 25X1 1.5 SAFETY (NEEDLE) ×4 IMPLANT
NS IRRIG 1000ML POUR BTL (IV SOLUTION) ×4 IMPLANT
PACK BASIN DAY SURGERY FS (CUSTOM PROCEDURE TRAY) ×4 IMPLANT
PENCIL BUTTON HOLSTER BLD 10FT (ELECTRODE) ×4 IMPLANT
SLEEVE SCD COMPRESS KNEE MED (MISCELLANEOUS) IMPLANT
SPONGE GAUZE 4X4 12PLY STER LF (GAUZE/BANDAGES/DRESSINGS) IMPLANT
SPONGE LAP 4X18 X RAY DECT (DISPOSABLE) ×2 IMPLANT
STAPLER VISISTAT 35W (STAPLE) IMPLANT
STRIP CLOSURE SKIN 1/2X4 (GAUZE/BANDAGES/DRESSINGS) IMPLANT
SUT MNCRL AB 4-0 PS2 18 (SUTURE) ×4 IMPLANT
SUT PROLENE 1 CT (SUTURE) IMPLANT
SUT PROLENE 2 0 CT2 30 (SUTURE) ×10 IMPLANT
SUT SILK 2 0 SH (SUTURE) IMPLANT
SUT SILK 2 0 TIES 17X18 (SUTURE) ×4
SUT SILK 2-0 18XBRD TIE BLK (SUTURE) ×2 IMPLANT
SUT SILK 3 0 SH 30 (SUTURE) IMPLANT
SUT VIC AB 2-0 CT1 27 (SUTURE)
SUT VIC AB 2-0 CT1 TAPERPNT 27 (SUTURE) IMPLANT
SUT VIC AB 2-0 SH 27 (SUTURE) ×4
SUT VIC AB 2-0 SH 27XBRD (SUTURE) ×2 IMPLANT
SUT VIC AB 3-0 54X BRD REEL (SUTURE) IMPLANT
SUT VIC AB 3-0 BRD 54 (SUTURE) ×4
SUT VIC AB 3-0 FS2 27 (SUTURE) IMPLANT
SUT VIC AB 3-0 SH 27 (SUTURE) ×8
SUT VIC AB 3-0 SH 27X BRD (SUTURE) ×2 IMPLANT
SUT VICRYL 3-0 CR8 SH (SUTURE) IMPLANT
SYR 20CC LL (SYRINGE) ×4 IMPLANT
SYRINGE 10CC LL (SYRINGE) ×4 IMPLANT
TOWEL OR NON WOVEN STRL DISP B (DISPOSABLE) ×4 IMPLANT
TRAY DSU PREP LF (CUSTOM PROCEDURE TRAY) ×6 IMPLANT
TUBE CONNECTING 20'X1/4 (TUBING) ×1
TUBE CONNECTING 20X1/4 (TUBING) ×3 IMPLANT
YANKAUER SUCT BULB TIP NO VENT (SUCTIONS) ×4 IMPLANT

## 2014-06-04 NOTE — Anesthesia Procedure Notes (Signed)
Anesthesia Regional Block:  TAP block  Pre-Anesthetic Checklist: ,, timeout performed, Correct Patient, Correct Site, Correct Laterality, Correct Procedure, Correct Position, site marked, Risks and benefits discussed,  Surgical consent,  Pre-op evaluation,  At surgeon's request and post-op pain management  Laterality: Right and Lower  Prep: chloraprep       Needles:  Injection technique: Single-shot  Needle Type: Echogenic Needle     Needle Length: 9cm 9 cm Needle Gauge: 21 and 21 G    Additional Needles:  Procedures: ultrasound guided (picture in chart) TAP block Narrative:  Start time: 06/04/2014 9:50 AM End time: 06/04/2014 9:55 AM Injection made incrementally with aspirations every 5 mL.  Performed by: Personally  Anesthesiologist: Sheldon Silvanavid Tonesha Tsou, MD

## 2014-06-04 NOTE — Transfer of Care (Signed)
Immediate Anesthesia Transfer of Care Note  Patient: Ryan Meza  Procedure(s) Performed: Procedure(s) (LRB): OPEN REPAIR RIGHT INGUINAL HERNIA REPAIR (Right) INSERTION OF MESH (N/A)  Patient Location: PACU  Anesthesia Type: General  Level of Consciousness: awake, oriented, sedated and patient cooperative  Airway & Oxygen Therapy: Patient Spontanous Breathing and Patient connected to face mask oxygen  Post-op Assessment: Report given to PACU RN and Post -op Vital signs reviewed and stable  Post vital signs: Reviewed and stable  Complications: No apparent anesthesia complications

## 2014-06-04 NOTE — Interval H&P Note (Signed)
History and Physical Interval Note:  06/04/2014 9:25 AM  Ryan Meza  has presented today for surgery, with the diagnosis of right inguinal hernia  The various methods of treatment have been discussed with the patient and family. After consideration of risks, benefits and other options for treatment, the patient has consented to  Procedure(s): OPEN REPAIR RIGHT INGUINAL HERNIA REPAIR (Right) INSERTION OF MESH (N/A) as a surgical intervention .  The patient's history has been reviewed, patient examined, no change in status, stable for surgery.  I have reviewed the patient's chart and labs.  Questions were answered to the patient's satisfaction.     Ernestene MentionINGRAM,Sharmila Wrobleski M

## 2014-06-04 NOTE — Op Note (Signed)
Patient Name:           Ryan Meza   Date of Surgery:        06/04/2014  Note: This dictation was prepared with Dragon/digital dictation along with [facility] technology. Any transcriptional errors that result from this process are unintentional.    Pre op Diagnosis:      Right inguinal hernia  Post op Diagnosis:    Right inguinal hernia  Procedure:                 Lichtenstein repair of right inguinal hernia with UltraPro mesh  Surgeon:                     Angelia MouldHaywood M. Derrell LollingIngram, M.D., FACS  Assistant:                      None  Operative Indications:   Ryan Meza is a 73 y.o. male. He is referred by Dr. Waynard EdwardsPerini for evaluation and management of a right inguinal hernia.  The patient gives a one-month history of a painful bulge in his right groin. It is reducible. He feels discomfort just about every day now. It is uncomfortable when walking. Examination reveals a reducible right inguinal hernia. When he stands it seems to feel inguinal canal but does not extend into the scrotum.  No prior history of hernia  Morbidities include former smoker, quit in 1971. 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 Open prostatectomy for benign prostatic hypertrophy. Cataract surgery. Generally healthy. Activity. Plays golf. He walks a lot. Planning beach trip later on this summer.   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 He had an indirect hernia sac. He had a small direct hernia.  Procedure in Detail:          Dr. Ivin Bootyrews performed a TAPP Block in the holding area. The patient was taken to the operating room and underwent general endotracheal anesthesia. The abdomen and groins and genitalia were prepped and draped in a sterile fashion. Intravenous antibiotics were given. Surgical time out was performed.    A transverse incision was made in the right groin, overlying the inguinal canal. Dissection was carried down through the  subcutaneous tissue to the external oblique. The external inguinal ring exposed. The external oblique was incised in the direction of its fibers opening of the external inguinal ring. The external oblique  was dissected away from the underlying structures and self-retaining retractors were placed. The ilioinguinal nerve was identified, isolated and traced back to its emergence from the muscles laterally, clamped, divided, and ligated with 2-0 silk tie. The redundant nerve  medially was resected.     The cord structures were mobilized and encircled with a Penrose drain. Cremasteric muscle fibers were divided and skeletonized. A large lipoma was identified, isolated, dissected back to the level of the internal ring, clamped divided and ligated with a 2-0 Vicryl tie. The lipoma was discarded. Small direct hernia was noted. Small to medium size indirect hernia sac was dissected away from the cord structures, opened and inspected and found to be empty. Palpation within the abdomen revealed there was no evidence of femoral hernia. The iliac vessels were quite soft with almost no calcification. The indirect sac was suture-ligated at the level of the internal ring with a suture ligature of 2-0 silk and the redundant sac resected. The floor of  the inguinal canal was repaired and reinforced with an onlay graft of ultra Pro mesh. A 3" x 6" piece of mesh was brought the operative field and trimmed at the corners to accommodate the wound. The mesh was sutured in  place with running sutures of 2-0 Prolene and mattress sutures of 2-0 Prolene. The mesh was sutured so as to generously overlap the fascia at the pubic tubercle, and along the inguinal ligament inferiorly. Medially, superiorly, and superolaterally interupted prolene sutures were placed. The mesh was incised laterally so as to wrap around the cord structures at the internal ring. The tails of the mesh were overlapped laterally and further sutures were placed laterally.  This provided very secure repair both medial and lateral to the internal ring but allowed a fingertip opening for the cord structures. The wound was irrigated with saline and hemostasis was excellent. The external oblique was closed with a running suture of 2-0 Vicryl placing the cord  structures deep to the external oblique. Scarpa's fascia was closed with 3-0 Vicryl sutures and the skin closed with a running 4-0 Monocryl and Dermabond. Ice pack was  Placed and  the patient taken to PACU in stable condition. EBL 10 cc. Counts correct. Complications none.     Angelia Mould. Derrell Lolling, M.D., FACS General and Minimally Invasive Surgery Breast and Colorectal Surgery  06/04/2014 11:32 AM

## 2014-06-04 NOTE — Discharge Instructions (Signed)
[facility] _______[facility] ° °UMBILICAL OR INGUINAL HERNIA REPAIR: POST OP INSTRUCTIONS ° °Always review your discharge instruction sheet given to you by the facility where your surgery was performed. °IF YOU HAVE DISABILITY OR FAMILY LEAVE FORMS, YOU MUST BRING THEM TO THE OFFICE FOR PROCESSING.   °DO NOT GIVE THEM TO YOUR DOCTOR. ° °1. A  prescription for pain medication may be given to you upon discharge.  Take your pain medication as prescribed, if needed.  If narcotic pain medicine is not needed, then you may take acetaminophen (Tylenol) or ibuprofen (Advil) as needed. °2. Take your usually prescribed medications unless otherwise directed. °3. If you need a refill on your pain medication, please contact your pharmacy.  They will contact our office to request authorization. Prescriptions will not be filled after 5 pm or on week-ends. °4. You should follow a light diet the first 24 hours after arrival home, such as soup and crackers, etc.  Be sure to include lots of fluids daily.  Resume your normal diet the day after surgery. °5. Most patients will experience some swelling and bruising around the umbilicus or in the groin and scrotum.  Ice packs and reclining will help.  Swelling and bruising can take several days to resolve.  °6. It is common to experience some constipation if taking pain medication after surgery.  Increasing fluid intake and taking a stool softener (such as Colace) will usually help or prevent this problem from occurring.  A mild laxative (Milk of Magnesia or Miralax) should be taken according to package directions if there are no bowel movements after 48 hours. °7. Unless discharge instructions indicate otherwise, you may remove your bandages 24-48 hours after surgery, and you may shower at that time.  You may have steri-strips (small skin tapes) in place directly over the incision.  These strips should be left on the skin for 7-10 days.  If your surgeon used skin glue on the  incision, you may shower in 24 hours.  The glue will flake off over the next 2-3 weeks.  Any sutures or staples will be removed at the office during your follow-up visit. °8. ACTIVITIES:  You may resume regular (light) daily activities beginning the next day--such as daily self-care, walking, climbing stairs--gradually increasing activities as tolerated.  You may have sexual intercourse when it is comfortable.  Refrain from any heavy lifting or straining until approved by your doctor. °a. You may drive when you are no longer taking prescription pain medication, you can comfortably wear a seatbelt, and you can safely maneuver your car and apply brakes. °b. RETURN TO WORK:  __________________________________________________________ °9. You should see your doctor in the office for a follow-up appointment approximately 2-3 weeks after your surgery.  Make sure that you call for this appointment within a day or two after you arrive home to insure a convenient appointment time. °10. OTHER INSTRUCTIONS:  __________________________________________________________________________________________________________________________________________________________________________________________  °WHEN TO CALL YOUR DOCTOR: °1. Fever over 101.0 °2. Inability to urinate °3. Nausea and/or vomiting °4. Extreme swelling or bruising °5. Continued bleeding from incision. °6. Increased pain, redness, or drainage from the incision ° °The clinic staff is available to answer your questions during regular business hours.  Please don’t hesitate to call and ask to speak to one of the nurses for clinical concerns.  If you have a medical emergency, go to the nearest emergency room or call 911.  A surgeon from [facility] is always on call at the hospital ° ° °  [address], [address], [place], [place]  [address] ? ° [address] [address], [place], [place]   [address] °[phone] ? [phone] ? FAX [phone] °Web site:  [email or web address] ° ° ° °Post Anesthesia Home Care Instructions ° °Activity: °Get plenty of rest for the remainder of the day. A responsible adult should stay with you for 24 hours following the procedure.  °For the next 24 hours, DO NOT: °-Drive a car °-Operate machinery °-Drink alcoholic beverages °-Take any medication unless instructed by your physician °-Make any legal decisions or sign important papers. ° °Meals: °Start with liquid foods such as gelatin or soup. Progress to regular foods as tolerated. Avoid greasy, spicy, heavy foods. If nausea and/or vomiting occur, drink only clear liquids until the nausea and/or vomiting subsides. Call your physician if vomiting continues. ° °Special Instructions/Symptoms: °Your throat may feel dry or sore from the anesthesia or the breathing tube placed in your throat during surgery. If this causes discomfort, gargle with warm salt water. The discomfort should disappear within 24 hours. ° °

## 2014-06-04 NOTE — Anesthesia Preprocedure Evaluation (Addendum)
Anesthesia Evaluation  Patient identified by MRN, date of birth, ID band Patient awake    Reviewed: Allergy & Precautions, H&P , NPO status , Patient's Chart, lab work & pertinent test results  Airway Mallampati: I TM Distance: >3 FB Neck ROM: Full    Dental  (+) Teeth Intact, Dental Advisory Given   Pulmonary former smoker,  breath sounds clear to auscultation        Cardiovascular hypertension, Pt. on medications Rhythm:Regular     Neuro/Psych    GI/Hepatic GERD-  Medicated and Controlled,  Endo/Other    Renal/GU      Musculoskeletal   Abdominal   Peds  Hematology   Anesthesia Other Findings   Reproductive/Obstetrics                          Anesthesia Physical Anesthesia Plan  ASA: II  Anesthesia Plan: General   Post-op Pain Management:    Induction: Intravenous  Airway Management Planned: Oral ETT  Additional Equipment:   Intra-op Plan:   Post-operative Plan: Extubation in OR  Informed Consent: I have reviewed the patients History and Physical, chart, labs and discussed the procedure including the risks, benefits and alternatives for the proposed anesthesia with the patient or authorized representative who has indicated his/her understanding and acceptance.   Dental advisory given  Plan Discussed with: CRNA, Surgeon and Anesthesiologist  Anesthesia Plan Comments:         Anesthesia Quick Evaluation

## 2014-06-04 NOTE — Progress Notes (Signed)
Assisted Dr. Crews with right, ultrasound guided, transabdominal plane block. Side rails up, monitors on throughout procedure. See vital signs in flow sheet. Tolerated Procedure well. 

## 2014-06-04 NOTE — Anesthesia Postprocedure Evaluation (Signed)
  Anesthesia Post-op Note  Patient: Ryan Meza  Procedure(s) Performed: Procedure(s): OPEN REPAIR RIGHT INGUINAL HERNIA REPAIR (Right) INSERTION OF MESH (N/A)  Patient Location: PACU  Anesthesia Type: General   Level of Consciousness: awake, alert  and oriented  Airway and Oxygen Therapy: Patient Spontanous Breathing  Post-op Pain: mild  Post-op Assessment: Post-op Vital signs reviewed  Post-op Vital Signs: Reviewed  Last Vitals:  Filed Vitals:   06/04/14 1305  BP: 140/72  Pulse: 58  Temp: 36.6 C  Resp: 18    Complications: No apparent anesthesia complications

## 2014-06-07 ENCOUNTER — Encounter (HOSPITAL_BASED_OUTPATIENT_CLINIC_OR_DEPARTMENT_OTHER): Payer: Self-pay | Admitting: General Surgery

## 2014-06-18 ENCOUNTER — Ambulatory Visit (INDEPENDENT_AMBULATORY_CARE_PROVIDER_SITE_OTHER): Payer: Medicare Other | Admitting: General Surgery

## 2014-06-18 ENCOUNTER — Encounter (INDEPENDENT_AMBULATORY_CARE_PROVIDER_SITE_OTHER): Payer: Self-pay | Admitting: General Surgery

## 2014-06-18 VITALS — BP 130/78 | HR 77 | Temp 97.6°F | Ht 72.0 in | Wt 180.0 lb

## 2014-06-18 DIAGNOSIS — K4090 Unilateral inguinal hernia, without obstruction or gangrene, not specified as recurrent: Secondary | ICD-10-CM

## 2014-06-18 NOTE — Progress Notes (Signed)
Patient ID: Ryan Meza, male   DOB: 19-Jan-1941, 73 y.o.   MRN: [id number]  History: The stoma underwent open repair of right anal hernia with mesh on 06/04/2014. Other than some edema he is doing well. Walking a lot. Driving his car. Also note when he can mow his lawn  Exam: Patient looks well. Fit. No distress Right groin incision is healing well. There is some edema but no hematoma, no fluid collection, no infection, no significant tenderness. The hernia repair is intact. In the scrotum and testes are normal  Assessment: Right inguinal hernia, recovering uneventfully 2 weeks postop  Plan: Diet and activity discussed. No sports or heavy lifting until September 15. We talked about how to get back to golf and full activities Return to see me as needed.   Angelia MouldHaywood M. Derrell LollingIngram, M.D., [facility] LPFACS [facility] General and Minimally invasive Surgery Breast and Colorectal Surgery Office:   [phone] Pager:   [phone]

## 2014-06-18 NOTE — Patient Instructions (Signed)
You are recovering from your right inguinal hernia repair with mesh without any obvious surgical complications.  You may resume normal physical activities without restriction after September 15.  Return to see Dr. Derrell LollingIngram if necessary.

## 2014-08-06 ENCOUNTER — Other Ambulatory Visit: Payer: Self-pay | Admitting: Orthopedic Surgery

## 2014-08-06 NOTE — Progress Notes (Signed)
Preoperative surgical orders have been place into the Epic hospital system for Ryan Meza on 08/06/2014, 3:30 PM  by Patrica DuelPERKINS, ALEXZANDREW for surgery on 08/30/2014.  Preop Left Total Knee orders including Experal, IV Tylenol, and IV Decadron as long as there are no contraindications to the above medications. Ryan Peacerew Perkins, PA-C

## 2014-08-13 ENCOUNTER — Other Ambulatory Visit: Payer: Self-pay

## 2014-08-20 ENCOUNTER — Encounter (HOSPITAL_COMMUNITY): Payer: Self-pay | Admitting: Pharmacy Technician

## 2014-08-24 ENCOUNTER — Encounter (HOSPITAL_COMMUNITY): Payer: Self-pay

## 2014-08-24 ENCOUNTER — Encounter (HOSPITAL_COMMUNITY)
Admission: RE | Admit: 2014-08-24 | Discharge: 2014-08-24 | Disposition: A | Discharge status: Home / Self Care | Payer: Medicare Other | Source: Ambulatory Visit | Attending: Orthopedic Surgery | Admitting: Orthopedic Surgery

## 2014-08-24 DIAGNOSIS — Z01812 Encounter for preprocedural laboratory examination: Secondary | ICD-10-CM | POA: Diagnosis not present

## 2014-08-24 DIAGNOSIS — Z22321 Carrier or suspected carrier of Methicillin susceptible Staphylococcus aureus: Secondary | ICD-10-CM | POA: Insufficient documentation

## 2014-08-24 HISTORY — DX: Personal history of other medical treatment: Z92.89

## 2014-08-24 HISTORY — DX: Unspecified glaucoma: H40.9

## 2014-08-24 HISTORY — DX: Diplopia: H53.2

## 2014-08-24 LAB — COMPREHENSIVE METABOLIC PANEL
ALT: 17 U/L (ref 0–53)
AST: 21 U/L (ref 0–37)
Albumin: 4 g/dL (ref 3.5–5.2)
Alkaline Phosphatase: 103 U/L (ref 39–117)
Anion gap: 10 (ref 5–15)
BILIRUBIN TOTAL: 0.5 mg/dL (ref 0.3–1.2)
BUN: 14 mg/dL (ref 6–23)
CHLORIDE: 103 meq/L (ref 96–112)
CO2: 27 mEq/L (ref 19–32)
Calcium: 9.8 mg/dL (ref 8.4–10.5)
Creatinine, Ser: 1.08 mg/dL (ref 0.50–1.35)
GFR calc Af Amer: 76 mL/min — ABNORMAL LOW (ref >90)
GFR calc non Af Amer: 65 mL/min — ABNORMAL LOW (ref >90)
Glucose, Bld: 119 mg/dL — ABNORMAL HIGH (ref 70–99)
POTASSIUM: 4.9 meq/L (ref 3.7–5.3)
SODIUM: 140 meq/L (ref 137–147)
Total Protein: 7.5 g/dL (ref 6.0–8.3)

## 2014-08-24 LAB — URINALYSIS, ROUTINE W REFLEX MICROSCOPIC
Bilirubin Urine: NEGATIVE
GLUCOSE, UA: NEGATIVE mg/dL
HGB URINE DIPSTICK: NEGATIVE
Ketones, ur: NEGATIVE mg/dL
Leukocytes, UA: NEGATIVE
Nitrite: NEGATIVE
Protein, ur: NEGATIVE mg/dL
SPECIFIC GRAVITY, URINE: 1.007 (ref 1.005–1.030)
UROBILINOGEN UA: 0.2 mg/dL (ref 0.0–1.0)
pH: 6.5 (ref 5.0–8.0)

## 2014-08-24 LAB — SURGICAL PCR SCREEN
MRSA, PCR: NEGATIVE
Staphylococcus aureus: POSITIVE — AB

## 2014-08-24 LAB — CBC
HEMATOCRIT: 42.5 % (ref 39.0–52.0)
Hemoglobin: 14 g/dL (ref 13.0–17.0)
MCH: 29.7 pg (ref 26.0–34.0)
MCHC: 32.9 g/dL (ref 30.0–36.0)
MCV: 90 fL (ref 78.0–100.0)
Platelets: 209 10*3/uL (ref 150–400)
RBC: 4.72 MIL/uL (ref 4.22–5.81)
RDW: 12.4 % (ref 11.5–15.5)
WBC: 7.2 10*3/uL (ref 4.0–10.5)

## 2014-08-24 LAB — PROTIME-INR
INR: 1.04 (ref 0.00–1.49)
Prothrombin Time: 13.7 seconds (ref 11.6–15.2)

## 2014-08-24 LAB — APTT: APTT: 31 s (ref 24–37)

## 2014-08-24 NOTE — Progress Notes (Signed)
Positive staph result faxed to Dr. Lequita HaltAluisio via EPIC. Called pt about results and mupirocin treatment- no questions or concerns. Mupirocin 22gram tube called into [facility] on [address].

## 2014-08-24 NOTE — Patient Instructions (Addendum)
18 Ryan Meza  08/24/2014   Your procedure is scheduled on: Monday 08/30/14  [facility] at 10:45 AM.  Call this number if you have problems the morning of surgery [phone]: [phone]   Remember:   Do not eat food After Midnight. Clear liquids from midnight until 07:45 am on 11/2//15 then nothing.       Take these medicines the morning of surgery with A SIP OF WATER: omeprazole, eye drops   Do not wear jewelry, make-up or nail polish.  Do not wear lotions, powders, or perfumes.   Do not shave 48 hours prior to surgery. Men may shave face and neck.  Do not bring valuables to the hospital.  Contacts, dentures or bridgework may not be worn into surgery.  Leave suitcase in the car. After surgery it may be brought to your room.  For patients admitted to the hospital, checkout time is 11:00 AM the day of discharge.   Please read over the following fact sheets that you were given: MRSA Information Birdie Sonsachel Mia Milan, RN  pre op nurse call if needed [phone]    [facility] - Preparing for Surgery Before surgery, you can play an important role.  Because skin is not sterile, your skin needs to be as free of germs as possible.  You can reduce the number of germs on your skin by washing with CHG (chlorahexidine gluconate) soap before surgery.  CHG is an antiseptic cleaner which kills germs and bonds with the skin to continue killing germs even after washing. Please DO NOT use if you have an allergy to CHG or antibacterial soaps.  If your skin becomes reddened/irritated stop using the CHG and inform your nurse when you arrive at Short Stay. Do not shave (including legs and underarms) for at least 48 hours prior to the first CHG shower.  You may shave your face/neck. Please follow these instructions carefully:  1.  Shower with CHG Soap the night before surgery and the  morning of Surgery.  2.  If you choose to wash your hair, wash your hair first as usual with your  normal   shampoo.  3.  After you shampoo, rinse your hair and body thoroughly to remove the  shampoo.                            4.  Use CHG as you would any other liquid soap.  You can apply chg directly  to the skin and wash                       Gently with a scrungie or clean washcloth.  5.  Apply the CHG Soap to your body ONLY FROM THE NECK DOWN.   Do not use on face/ open                           Wound or open sores. Avoid contact with eyes, ears mouth and genitals (private parts).                       Wash face,  Genitals (private parts) with your normal soap.             6.  Wash thoroughly, paying special attention to the area where your surgery  will be performed.  7.  Thoroughly rinse your body with warm  water from the neck down.  8.  DO NOT shower/wash with your normal soap after using and rinsing off  the CHG Soap.                9.  Pat yourself dry with a clean towel.            10.  Wear clean pajamas.            11.  Place clean sheets on your bed the night of your first shower and do not  sleep with pets. Day of Surgery : Do not apply any lotions/deodorants the morning of surgery.  Please wear clean clothes to the hospital/surgery center.  FAILURE TO FOLLOW THESE INSTRUCTIONS MAY RESULT IN THE CANCELLATION OF YOUR SURGERY PATIENT SIGNATURE_________________________________  NURSE SIGNATURE__________________________________  ________________________________________________________________________  WHAT IS A BLOOD TRANSFUSION? Blood Transfusion Information  A transfusion is the replacement of blood or some of its parts. Blood is made up of multiple cells which provide different functions.  Red blood cells carry oxygen and are used for blood loss replacement.  White blood cells fight against infection.  Platelets control bleeding.  Plasma helps clot blood.  Other blood products are available for specialized needs, such as hemophilia or other clotting disorders. BEFORE THE  TRANSFUSION  Who gives blood for transfusions?   Healthy volunteers who are fully evaluated to make sure their blood is safe. This is blood bank blood. Transfusion therapy is the safest it has ever been in the practice of medicine. Before blood is taken from a donor, a complete history is taken to make sure that person has no history of diseases nor engages in risky social behavior (examples are intravenous drug use or sexual activity with multiple partners). The donor's travel history is screened to minimize risk of transmitting infections, such as malaria. The donated blood is tested for signs of infectious diseases, such as HIV and hepatitis. The blood is then tested to be sure it is compatible with you in order to minimize the chance of a transfusion reaction. If you or a relative donates blood, this is often done in anticipation of surgery and is not appropriate for emergency situations. It takes many days to process the donated blood. RISKS AND COMPLICATIONS Although transfusion therapy is very safe and saves many lives, the main dangers of transfusion include:   Getting an infectious disease.  Developing a transfusion reaction. This is an allergic reaction to something in the blood you were given. Every precaution is taken to prevent this. The decision to have a blood transfusion has been considered carefully by your caregiver before blood is given. Blood is not given unless the benefits outweigh the risks. AFTER THE TRANSFUSION  Right after receiving a blood transfusion, you will usually feel much better and more energetic. This is especially true if your red blood cells have gotten low (anemic). The transfusion raises the level of the red blood cells which carry oxygen, and this usually causes an energy increase.  The nurse administering the transfusion will monitor you carefully for complications. HOME CARE INSTRUCTIONS  No special instructions are needed after a transfusion. You may find  your energy is better. Speak with your caregiver about any limitations on activity for underlying diseases you may have. SEEK MEDICAL CARE IF:   Your condition is not improving after your transfusion.  You develop redness or irritation at the intravenous (IV) site. SEEK IMMEDIATE MEDICAL CARE IF:  Any of the following symptoms occur over the  next 12 hours:  Shaking chills.  You have a temperature by mouth above 102 F (38.9 C), not controlled by medicine.  Chest, back, or muscle pain.  People around you feel you are not acting correctly or are confused.  Shortness of breath or difficulty breathing.  Dizziness and fainting.  You get a rash or develop hives.  You have a decrease in urine output.  Your urine turns a dark color or changes to pink, red, or brown. Any of the following symptoms occur over the next 10 days:  You have a temperature by mouth above 102 F (38.9 C), not controlled by medicine.  Shortness of breath.  Weakness after normal activity.  The white part of the eye turns yellow (jaundice).  You have a decrease in the amount of urine or are urinating less often.  Your urine turns a dark color or changes to pink, red, or brown. Document Released: 10/12/2000 Document Revised: 01/07/2012 Document Reviewed: 05/31/2008 ExitCare Patient Information 2014 [place], [place].  _______________________________________________________________________  Incentive Spirometer  An incentive spirometer is a tool that can help keep your lungs clear and active. This tool measures how well you are filling your lungs with each breath. Taking long deep breaths may help reverse or decrease the chance of developing breathing (pulmonary) problems (especially infection) following:  A long period of time when you are unable to move or be active. BEFORE THE PROCEDURE   If the spirometer includes an indicator to show your best effort, your nurse or respiratory therapist will set it  to a desired goal.  If possible, sit up straight or lean slightly forward. Try not to slouch.  Hold the incentive spirometer in an upright position. INSTRUCTIONS FOR USE  1. Sit on the edge of your bed if possible, or sit up as far as you can in bed or on a chair. 2. Hold the incentive spirometer in an upright position. 3. Breathe out normally. 4. Place the mouthpiece in your mouth and seal your lips tightly around it. 5. Breathe in slowly and as deeply as possible, raising the piston or the ball toward the top of the column. 6. Hold your breath for 3-5 seconds or for as long as possible. Allow the piston or ball to fall to the bottom of the column. 7. Remove the mouthpiece from your mouth and breathe out normally. 8. Rest for a few seconds and repeat Steps 1 through 7 at least 10 times every 1-2 hours when you are awake. Take your time and take a few normal breaths between deep breaths. 9. The spirometer may include an indicator to show your best effort. Use the indicator as a goal to work toward during each repetition. 10. After each set of 10 deep breaths, practice coughing to be sure your lungs are clear. If you have an incision (the cut made at the time of surgery), support your incision when coughing by placing a pillow or rolled up towels firmly against it. Once you are able to get out of bed, walk around indoors and cough well. You may stop using the incentive spirometer when instructed by your caregiver.  RISKS AND COMPLICATIONS  Take your time so you do not get dizzy or light-headed.  If you are in pain, you may need to take or ask for pain medication before doing incentive spirometry. It is harder to take a deep breath if you are having pain. AFTER USE  Rest and breathe slowly and easily.  It can be helpful  to keep track of a log of your progress. Your caregiver can provide you with a simple table to help with this. If you are using the spirometer at home, follow these  instructions: SEEK MEDICAL CARE IF:   You are having difficultly using the spirometer.  You have trouble using the spirometer as often as instructed.  Your pain medication is not giving enough relief while using the spirometer.  You develop fever of 100.5 F (38.1 C) or higher. SEEK IMMEDIATE MEDICAL CARE IF:   You cough up bloody sputum that had not been present before.  You develop fever of 102 F (38.9 C) or greater.  You develop worsening pain at or near the incision site. MAKE SURE YOU:   Understand these instructions.  Will watch your condition.  Will get help right away if you are not doing well or get worse. Document Released: 02/25/2007 Document Revised: 01/07/2012 Document Reviewed: 04/28/2007 ExitCare Patient Information 2014 [employer], [place].   ________________________________________________________________________    CLEAR LIQUID DIET   Foods Allowed                                                                     Foods Excluded  Coffee and tea, regular and decaf                             liquids that you cannot  Plain Jell-O in any flavor                                             see through such as: Fruit ices (not with fruit pulp)                                     milk, soups, orange juice  Iced Popsicles                                    All solid food Carbonated beverages, regular and diet                                    Cranberry, grape and apple juices Sports drinks like Gatorade Lightly seasoned clear broth or consume(fat free) Sugar, honey syrup  Sample Menu Breakfast                                Lunch                                     Supper Cranberry juice                    Beef broth  Chicken broth Jell-O                                     Grape juice                           Apple juice Coffee or tea                        Jell-O                                      Popsicle                                                 Coffee or tea                        Coffee or tea  _____________________________________________________________________

## 2014-08-24 NOTE — Progress Notes (Signed)
Surgery clearance note 05/03/14 Dr. Kallie EdwardPenini on chart, CXR 2013 on chart, EKG 06/01/14 on EPIC

## 2014-08-29 ENCOUNTER — Other Ambulatory Visit: Payer: Self-pay | Admitting: Orthopedic Surgery

## 2014-08-29 NOTE — H&P (Signed)
Ryan Meza DOB: 1941-03-09 Married / Language: English / Race: White Male Date of Admission:  08-30-2014 Chief Complaint:  Left Knee Pain History of Present Illness The patient is a 73 year old male who comes in for a preoperative history and physical. The patient is scheduled for a left total knee arthroplasty to be performed by Dr. Gus RankinFrank V. Aluisio, MD at [facility] on 08/30/2014. The patient is seen for bilateral knee problems. The patient reports left knee and right knee symptoms including: pain . He states the knee has been bothering him for a long time. He has pain with most activities including at rest. He has significant deformity in both knees and he is concerned that is making him want to fall. He is concerned about injuring himself in a fall. He is not having hip pain with this.  He is ready to get the knees fixed. They have been treated conservatively in the past for the above stated problem and despite conservative measures, they continue to have progressive pain and severe functional limitations and dysfunction. They have failed non-operative management including home exercise, medications. It is felt that they would benefit from undergoing total joint replacement. Risks and benefits of the procedure have been discussed with the patient and they elect to proceed with surgery. There are no active contraindications to surgery such as ongoing infection or rapidly progressive neurological disease.  Problem List/Past Medical Primary osteoarthritis of knees High blood pressure Hypercholesterolemia Glaucoma Cataract  Allergies  No Known Drug Allergies  Family History  Congestive Heart Failure Father.  Social History  No history of drug/alcohol rehab Not under pain contract Marital status married Never consumed alcohol 04/27/2014: Never consumed alcohol Number of flights of stairs before winded 2-3 Tobacco / smoke exposure 04/27/2014: no Tobacco use  Former smoker. 04/27/2014: smoke(d) 1 1/2 pack(s) per day Living situation live with spouse Current work status retired Exercise Exercises rarely; does other Children 2  Medication History Combigan (0.2-0.5% Solution, Ophthalmic) Active. Lumigan (0.01% Solution, Ophthalmic) Active. Atorvastatin Calcium (80MG  Tablet, Oral) Active. Lisinopril (5MG  Tablet, Oral) Active. Omeprazole (20MG  Tablet DR, Oral) Active. Vitamin D3 (1000UNIT Tablet, Oral) Active. Multivitamin (Oral) Active. Aspirin EC Low Strength (81MG  Tablet DR, Oral) Active. Fiber (Oral) Active.  Past Surgical History Cataract Surgery right Prostatectomy; Transurethral  Review of Systems  General Not Present- Chills, Fatigue, Fever, Memory Loss, Night Sweats, Weight Gain and Weight Loss. Skin Not Present- Eczema, Hives, Itching, Lesions and Rash. HEENT Not Present- Dentures, Double Vision, Headache, Hearing Loss, Tinnitus and Visual Loss. Respiratory Not Present- Allergies, Chronic Cough, Coughing up blood, Shortness of breath at rest and Shortness of breath with exertion. Cardiovascular Not Present- Chest Pain, Difficulty Breathing Lying Down, Murmur, Palpitations, Racing/skipping heartbeats and Swelling. Gastrointestinal Not Present- Abdominal Pain, Bloody Stool, Constipation, Diarrhea, Difficulty Swallowing, Heartburn, Jaundice, Loss of appetitie, Nausea and Vomiting. Male Genitourinary Not Present- Blood in Urine, Discharge, Flank Pain, Incontinence, Painful Urination, Urgency, Urinary frequency, Urinary Retention, Urinating at Night and Weak urinary stream. Musculoskeletal Present- Joint Pain. Not Present- Back Pain, Joint Swelling, Morning Stiffness, Muscle Pain, Muscle Weakness and Spasms. Neurological Not Present- Blackout spells, Difficulty with balance, Dizziness, Paralysis, Tremor and Weakness. Psychiatric Not Present- Insomnia  Vitals  Weight: 178 lb Height: 72in Weight was reported by  patient. Height was reported by patient. Body Surface Area: 2.03 m Body Mass Index: 24.14 kg/m  BP: 140/74 (Sitting, Right Arm, Standard)   Physical Exam General Mental Status -Alert, cooperative and good historian. General Appearance-pleasant, Not  in acute distress. Orientation-Oriented X3. Build & Nutrition-Well nourished and Well developed.  Head and Neck Head-normocephalic, atraumatic . Neck Global Assessment - supple, no bruit auscultated on the right, no bruit auscultated on the left. Note: full upper and lower dentures   Eye Vision-Wears corrective lenses. Pupil - Bilateral-Regular and Round. Motion - Bilateral-EOMI.  Chest and Lung Exam Auscultation Breath sounds - clear at anterior chest wall and clear at posterior chest wall. Adventitious sounds - No Adventitious sounds.  Cardiovascular Auscultation Rhythm - Regular rate and rhythm. Heart Sounds - S1 WNL and S2 WNL. Murmurs & Other Heart Sounds - Auscultation of the heart reveals - No Murmurs.  Abdomen Palpation/Percussion Tenderness - Abdomen is non-tender to palpation. Rigidity (guarding) - Abdomen is soft. Auscultation Auscultation of the abdomen reveals - Bowel sounds normal.  Male Genitourinary Note: Not done, not pertinent to present illness   Musculoskeletal Note: On exam, well developed male alert and oriented in no apparent distress. His hips show normal range of motion with no discomfort. The left knee shows significant varus deformity. Range is about 5-125. There is no tenderness or instability on that side. His right knee has severe varus. Range is 5-130. Marked crepitus on range of motion. Tender medial greater than lateral with no instability.   Assessment & Plan  Primary localized osteoarthritis of right knee (M17.11) Primary osteoarthritis of knee, left Note:Plan is for a Left Total Knee Replacement by Dr. Lequita HaltAluisio.  Plan is to go home  PCP - Dr. Rodrigo RanMark  Perini - Patient has been seen preoperatively and felt to be stable for surgery.  The patient does not have any contraindications and will receive TXA (tranexamic acid) prior to surgery.  Signed electronically by Lauraine RinneAlexzandrew L Waris Rodger, III PA-C

## 2014-08-30 ENCOUNTER — Encounter (HOSPITAL_COMMUNITY): Payer: Self-pay | Admitting: *Deleted

## 2014-08-30 ENCOUNTER — Inpatient Hospital Stay (HOSPITAL_COMMUNITY): Payer: Medicare Other | Admitting: Anesthesiology

## 2014-08-30 ENCOUNTER — Inpatient Hospital Stay (HOSPITAL_COMMUNITY)
Admission: RE | Admit: 2014-08-30 | Discharge: 2014-09-01 | DRG: 470 | Disposition: A | Discharge status: Home Health | Payer: Medicare Other | Source: Ambulatory Visit | Attending: Orthopedic Surgery | Admitting: Orthopedic Surgery

## 2014-08-30 ENCOUNTER — Encounter (HOSPITAL_COMMUNITY)
Admission: RE | Disposition: A | Discharge status: Home Health | Payer: Self-pay | Source: Ambulatory Visit | Attending: Orthopedic Surgery

## 2014-08-30 ENCOUNTER — Inpatient Hospital Stay (HOSPITAL_COMMUNITY): Payer: Medicare Other

## 2014-08-30 DIAGNOSIS — H409 Unspecified glaucoma: Secondary | ICD-10-CM | POA: Diagnosis present

## 2014-08-30 DIAGNOSIS — M25562 Pain in left knee: Secondary | ICD-10-CM | POA: Diagnosis present

## 2014-08-30 DIAGNOSIS — M179 Osteoarthritis of knee, unspecified: Secondary | ICD-10-CM | POA: Diagnosis present

## 2014-08-30 DIAGNOSIS — K219 Gastro-esophageal reflux disease without esophagitis: Secondary | ICD-10-CM | POA: Diagnosis present

## 2014-08-30 DIAGNOSIS — M1712 Unilateral primary osteoarthritis, left knee: Secondary | ICD-10-CM | POA: Diagnosis present

## 2014-08-30 DIAGNOSIS — Z6824 Body mass index (BMI) 24.0-24.9, adult: Secondary | ICD-10-CM

## 2014-08-30 DIAGNOSIS — Z01812 Encounter for preprocedural laboratory examination: Secondary | ICD-10-CM

## 2014-08-30 DIAGNOSIS — E785 Hyperlipidemia, unspecified: Secondary | ICD-10-CM | POA: Diagnosis present

## 2014-08-30 DIAGNOSIS — I10 Essential (primary) hypertension: Secondary | ICD-10-CM

## 2014-08-30 DIAGNOSIS — M1710 Unilateral primary osteoarthritis, unspecified knee: Secondary | ICD-10-CM | POA: Diagnosis present

## 2014-08-30 HISTORY — PX: TOTAL KNEE ARTHROPLASTY: SHX125

## 2014-08-30 LAB — TYPE AND SCREEN
ABO/RH(D): O POS
ANTIBODY SCREEN: NEGATIVE

## 2014-08-30 SURGERY — ARTHROPLASTY, KNEE, TOTAL
Anesthesia: Monitor Anesthesia Care | Site: Knee | Laterality: Left

## 2014-08-30 MED ORDER — ONDANSETRON HCL 4 MG/2ML IJ SOLN: 4.0000 mg | Freq: Four times a day (QID) | INTRAMUSCULAR | Status: DC | PRN

## 2014-08-30 MED ORDER — PROPOFOL 10 MG/ML IV BOLUS
INTRAVENOUS | Status: AC
  Filled 2014-08-30: qty 20

## 2014-08-30 MED ORDER — BUPIVACAINE LIPOSOME 1.3 % IJ SUSP
20.0000 mL | Freq: Once | INTRAMUSCULAR | Status: DC
  Filled 2014-08-30: qty 20

## 2014-08-30 MED ORDER — BRIMONIDINE TARTRATE-TIMOLOL 0.2-0.5 % OP SOLN
1.0000 [drp] | Freq: Two times a day (BID) | OPHTHALMIC | Status: DC
  Filled 2014-08-30: qty 5

## 2014-08-30 MED ORDER — FLEET ENEMA 7-19 GM/118ML RE ENEM: 1.0000 | ENEMA | Freq: Once | RECTAL | Status: AC | PRN

## 2014-08-30 MED ORDER — SODIUM CHLORIDE 0.9 % IJ SOLN
INTRAMUSCULAR | Status: DC | PRN
  Administered 2014-08-30: 30 mL

## 2014-08-30 MED ORDER — METOCLOPRAMIDE HCL 10 MG PO TABS: 5.0000 mg | ORAL_TABLET | Freq: Three times a day (TID) | ORAL | Status: DC | PRN

## 2014-08-30 MED ORDER — FENTANYL CITRATE 0.05 MG/ML IJ SOLN
INTRAMUSCULAR | Status: DC | PRN
  Administered 2014-08-30 (×2): 50 ug via INTRAVENOUS

## 2014-08-30 MED ORDER — METHOCARBAMOL 500 MG PO TABS
500.0000 mg | ORAL_TABLET | Freq: Four times a day (QID) | ORAL | Status: DC | PRN
  Administered 2014-08-31: 500 mg via ORAL
  Filled 2014-08-30: qty 1

## 2014-08-30 MED ORDER — DIPHENHYDRAMINE HCL 12.5 MG/5ML PO ELIX: 12.5000 mg | ORAL_SOLUTION | ORAL | Status: DC | PRN

## 2014-08-30 MED ORDER — SODIUM CHLORIDE 0.9 % IV SOLN
INTRAVENOUS | Status: DC
  Administered 2014-08-30 – 2014-08-31 (×2): via INTRAVENOUS

## 2014-08-30 MED ORDER — ONDANSETRON HCL 4 MG/2ML IJ SOLN
INTRAMUSCULAR | Status: DC | PRN
  Administered 2014-08-30: 4 mg via INTRAVENOUS

## 2014-08-30 MED ORDER — DOCUSATE SODIUM 100 MG PO CAPS
100.0000 mg | ORAL_CAPSULE | Freq: Two times a day (BID) | ORAL | Status: DC
  Administered 2014-08-30 – 2014-09-01 (×4): 100 mg via ORAL

## 2014-08-30 MED ORDER — KETOROLAC TROMETHAMINE 15 MG/ML IJ SOLN: 7.5000 mg | Freq: Four times a day (QID) | INTRAMUSCULAR | Status: AC | PRN

## 2014-08-30 MED ORDER — RIVAROXABAN 10 MG PO TABS
10.0000 mg | ORAL_TABLET | Freq: Every day | ORAL | Status: DC
  Administered 2014-08-31 – 2014-09-01 (×2): 10 mg via ORAL
  Filled 2014-08-30 (×4): qty 1

## 2014-08-30 MED ORDER — DEXAMETHASONE SODIUM PHOSPHATE 10 MG/ML IJ SOLN
INTRAMUSCULAR | Status: AC
  Filled 2014-08-30: qty 1

## 2014-08-30 MED ORDER — HYDROMORPHONE HCL 1 MG/ML IJ SOLN
0.2500 mg | INTRAMUSCULAR | Status: DC | PRN
  Administered 2014-08-30 (×3): 0.5 mg via INTRAVENOUS

## 2014-08-30 MED ORDER — OXYCODONE HCL 5 MG PO TABS
5.0000 mg | ORAL_TABLET | ORAL | Status: DC | PRN
  Administered 2014-08-30 – 2014-09-01 (×5): 5 mg via ORAL
  Filled 2014-08-30 (×5): qty 1

## 2014-08-30 MED ORDER — ACETAMINOPHEN 325 MG PO TABS
650.0000 mg | ORAL_TABLET | Freq: Four times a day (QID) | ORAL | Status: DC | PRN
  Administered 2014-08-31 – 2014-09-01 (×2): 650 mg via ORAL
  Filled 2014-08-30 (×2): qty 2

## 2014-08-30 MED ORDER — MENTHOL 3 MG MT LOZG
1.0000 | LOZENGE | OROMUCOSAL | Status: DC | PRN
  Filled 2014-08-30: qty 9

## 2014-08-30 MED ORDER — METHOCARBAMOL 1000 MG/10ML IJ SOLN
500.0000 mg | Freq: Four times a day (QID) | INTRAVENOUS | Status: DC | PRN
  Filled 2014-08-30: qty 5

## 2014-08-30 MED ORDER — CEFAZOLIN SODIUM-DEXTROSE 2-3 GM-% IV SOLR
INTRAVENOUS | Status: AC
  Filled 2014-08-30: qty 50

## 2014-08-30 MED ORDER — CEFAZOLIN SODIUM-DEXTROSE 2-3 GM-% IV SOLR
2.0000 g | Freq: Four times a day (QID) | INTRAVENOUS | Status: AC
  Administered 2014-08-30 – 2014-08-31 (×2): 2 g via INTRAVENOUS
  Filled 2014-08-30 (×2): qty 50

## 2014-08-30 MED ORDER — BUPIVACAINE HCL 0.25 % IJ SOLN
INTRAMUSCULAR | Status: DC | PRN
  Administered 2014-08-30: 20 mL

## 2014-08-30 MED ORDER — LATANOPROST 0.005 % OP SOLN
1.0000 [drp] | Freq: Every day | OPHTHALMIC | Status: DC
  Administered 2014-08-30 – 2014-08-31 (×2): 1 [drp] via OPHTHALMIC
  Filled 2014-08-30: qty 2.5

## 2014-08-30 MED ORDER — ONDANSETRON HCL 4 MG/2ML IJ SOLN
INTRAMUSCULAR | Status: AC
  Filled 2014-08-30: qty 2

## 2014-08-30 MED ORDER — PROPOFOL INFUSION 10 MG/ML OPTIME
INTRAVENOUS | Status: DC | PRN
  Administered 2014-08-30: 120 ug/kg/min via INTRAVENOUS

## 2014-08-30 MED ORDER — DEXAMETHASONE SODIUM PHOSPHATE 10 MG/ML IJ SOLN
10.0000 mg | Freq: Once | INTRAMUSCULAR | Status: AC
  Administered 2014-08-31: 10 mg via INTRAVENOUS
  Filled 2014-08-30: qty 1

## 2014-08-30 MED ORDER — TRANEXAMIC ACID 100 MG/ML IV SOLN
1000.0000 mg | INTRAVENOUS | Status: AC
  Administered 2014-08-30: 1000 mg via INTRAVENOUS
  Filled 2014-08-30: qty 10

## 2014-08-30 MED ORDER — BUPIVACAINE LIPOSOME 1.3 % IJ SUSP
INTRAMUSCULAR | Status: DC | PRN
  Administered 2014-08-30: 20 mL

## 2014-08-30 MED ORDER — BRIMONIDINE TARTRATE 0.2 % OP SOLN
1.0000 [drp] | Freq: Two times a day (BID) | OPHTHALMIC | Status: DC
  Administered 2014-08-30 – 2014-09-01 (×4): 1 [drp] via OPHTHALMIC
  Filled 2014-08-30: qty 5

## 2014-08-30 MED ORDER — ATORVASTATIN CALCIUM 40 MG PO TABS
40.0000 mg | ORAL_TABLET | ORAL | Status: DC
Start: 2014-08-31 — End: 2014-09-01
  Administered 2014-08-31: 40 mg via ORAL
  Filled 2014-08-30: qty 1

## 2014-08-30 MED ORDER — TIMOLOL MALEATE 0.5 % OP SOLN
1.0000 [drp] | Freq: Two times a day (BID) | OPHTHALMIC | Status: DC
  Administered 2014-08-30 – 2014-09-01 (×4): 1 [drp] via OPHTHALMIC
  Filled 2014-08-30: qty 5

## 2014-08-30 MED ORDER — ONDANSETRON HCL 4 MG PO TABS: 4.0000 mg | ORAL_TABLET | Freq: Four times a day (QID) | ORAL | Status: DC | PRN

## 2014-08-30 MED ORDER — MORPHINE SULFATE 2 MG/ML IJ SOLN: 1.0000 mg | INTRAMUSCULAR | Status: DC | PRN

## 2014-08-30 MED ORDER — BUPIVACAINE HCL (PF) 0.25 % IJ SOLN
INTRAMUSCULAR | Status: AC
  Filled 2014-08-30: qty 30

## 2014-08-30 MED ORDER — PANTOPRAZOLE SODIUM 40 MG PO TBEC
40.0000 mg | DELAYED_RELEASE_TABLET | Freq: Every day | ORAL | Status: DC
Start: 2014-08-31 — End: 2014-08-31
  Administered 2014-08-31: 40 mg via ORAL
  Filled 2014-08-30: qty 1

## 2014-08-30 MED ORDER — DEXAMETHASONE SODIUM PHOSPHATE 10 MG/ML IJ SOLN
10.0000 mg | Freq: Once | INTRAMUSCULAR | Status: AC
  Administered 2014-08-30: 10 mg via INTRAVENOUS

## 2014-08-30 MED ORDER — LACTATED RINGERS IV SOLN
INTRAVENOUS | Status: DC
Start: 2014-08-30 — End: 2014-08-30
  Administered 2014-08-30 (×2): via INTRAVENOUS
  Administered 2014-08-30: 1000 mL via INTRAVENOUS

## 2014-08-30 MED ORDER — METOCLOPRAMIDE HCL 5 MG/ML IJ SOLN: 5.0000 mg | Freq: Three times a day (TID) | INTRAMUSCULAR | Status: DC | PRN

## 2014-08-30 MED ORDER — FENTANYL CITRATE 0.05 MG/ML IJ SOLN
INTRAMUSCULAR | Status: AC
  Filled 2014-08-30: qty 2

## 2014-08-30 MED ORDER — HYDROMORPHONE HCL 1 MG/ML IJ SOLN
INTRAMUSCULAR | Status: AC
  Filled 2014-08-30: qty 1

## 2014-08-30 MED ORDER — BUPIVACAINE HCL (PF) 0.75 % IJ SOLN
INTRAMUSCULAR | Status: DC | PRN
  Administered 2014-08-30: 12 mg via INTRATHECAL

## 2014-08-30 MED ORDER — POLYETHYLENE GLYCOL 3350 17 G PO PACK: 17.0000 g | PACK | Freq: Every day | ORAL | Status: DC | PRN

## 2014-08-30 MED ORDER — ACETAMINOPHEN 10 MG/ML IV SOLN
1000.0000 mg | Freq: Once | INTRAVENOUS | Status: AC
  Administered 2014-08-30: 1000 mg via INTRAVENOUS
  Filled 2014-08-30: qty 100

## 2014-08-30 MED ORDER — CEFAZOLIN SODIUM-DEXTROSE 2-3 GM-% IV SOLR
2.0000 g | INTRAVENOUS | Status: AC
Start: 2014-08-30 — End: 2014-08-30
  Administered 2014-08-30: 2 g via INTRAVENOUS

## 2014-08-30 MED ORDER — PHENOL 1.4 % MT LIQD
1.0000 | OROMUCOSAL | Status: DC | PRN
  Filled 2014-08-30: qty 177

## 2014-08-30 MED ORDER — MIDAZOLAM HCL 5 MG/5ML IJ SOLN
INTRAMUSCULAR | Status: DC | PRN
  Administered 2014-08-30 (×2): 1 mg via INTRAVENOUS

## 2014-08-30 MED ORDER — ATORVASTATIN CALCIUM 40 MG PO TABS
80.0000 mg | ORAL_TABLET | ORAL | Status: DC
  Administered 2014-08-30: 80 mg via ORAL
  Filled 2014-08-30 (×2): qty 2

## 2014-08-30 MED ORDER — ACETAMINOPHEN 650 MG RE SUPP: 650.0000 mg | Freq: Four times a day (QID) | RECTAL | Status: DC | PRN

## 2014-08-30 MED ORDER — MIDAZOLAM HCL 2 MG/2ML IJ SOLN
INTRAMUSCULAR | Status: AC
  Filled 2014-08-30: qty 2

## 2014-08-30 MED ORDER — BISACODYL 10 MG RE SUPP: 10.0000 mg | Freq: Every day | RECTAL | Status: DC | PRN

## 2014-08-30 MED ORDER — TRAMADOL HCL 50 MG PO TABS: 50.0000 mg | ORAL_TABLET | Freq: Four times a day (QID) | ORAL | Status: DC | PRN

## 2014-08-30 MED ORDER — SODIUM CHLORIDE 0.9 % IV SOLN: INTRAVENOUS | Status: DC

## 2014-08-30 MED ORDER — SODIUM CHLORIDE 0.9 % IJ SOLN
INTRAMUSCULAR | Status: AC
  Filled 2014-08-30: qty 50

## 2014-08-30 MED ORDER — ACETAMINOPHEN 500 MG PO TABS
1000.0000 mg | ORAL_TABLET | Freq: Four times a day (QID) | ORAL | Status: AC
  Administered 2014-08-30 – 2014-08-31 (×4): 1000 mg via ORAL
  Filled 2014-08-30 (×5): qty 2

## 2014-08-30 SURGICAL SUPPLY — 63 items
BAG SPEC THK2 15X12 ZIP CLS (MISCELLANEOUS) ×1
BAG ZIPLOCK 12X15 (MISCELLANEOUS) ×3 IMPLANT
BANDAGE ELASTIC 6 VELCRO ST LF (GAUZE/BANDAGES/DRESSINGS) ×3 IMPLANT
BANDAGE ESMARK 6X9 LF (GAUZE/BANDAGES/DRESSINGS) ×1 IMPLANT
BLADE SAG 18X100X1.27 (BLADE) ×3 IMPLANT
BLADE SAW SGTL 11.0X1.19X90.0M (BLADE) ×3 IMPLANT
BNDG CMPR 9X6 STRL LF SNTH (GAUZE/BANDAGES/DRESSINGS) ×1
BNDG ESMARK 6X9 LF (GAUZE/BANDAGES/DRESSINGS) ×3
BOWL SMART MIX CTS (DISPOSABLE) ×3 IMPLANT
CAPT RP KNEE ×2 IMPLANT
CEMENT HV SMART SET (Cement) ×6 IMPLANT
CLOSURE WOUND 1/2 X4 (GAUZE/BANDAGES/DRESSINGS) ×1
CUFF TOURN SGL QUICK 34 (TOURNIQUET CUFF) ×3
CUFF TRNQT CYL 34X4X40X1 (TOURNIQUET CUFF) ×1 IMPLANT
DECANTER SPIKE VIAL GLASS SM (MISCELLANEOUS) ×3 IMPLANT
DRAPE EXTREMITY TIBURON (DRAPES) ×3 IMPLANT
DRAPE POUCH INSTRU U-SHP 10X18 (DRAPES) ×3 IMPLANT
DRAPE U-SHAPE 47X51 STRL (DRAPES) ×3 IMPLANT
DRSG ADAPTIC 3X8 NADH LF (GAUZE/BANDAGES/DRESSINGS) ×3 IMPLANT
DRSG PAD ABDOMINAL 8X10 ST (GAUZE/BANDAGES/DRESSINGS) ×1 IMPLANT
DURAPREP 26ML APPLICATOR (WOUND CARE) ×3 IMPLANT
ELECT REM PT RETURN 9FT ADLT (ELECTROSURGICAL) ×3
ELECTRODE REM PT RTRN 9FT ADLT (ELECTROSURGICAL) ×1 IMPLANT
EVACUATOR 1/8 PVC DRAIN (DRAIN) ×3 IMPLANT
FACESHIELD WRAPAROUND (MASK) ×15 IMPLANT
FACESHIELD WRAPAROUND OR TEAM (MASK) ×5 IMPLANT
GAUZE SPONGE 4X4 12PLY STRL (GAUZE/BANDAGES/DRESSINGS) ×3 IMPLANT
GLOVE BIO SURGEON STRL SZ7.5 (GLOVE) IMPLANT
GLOVE BIO SURGEON STRL SZ8 (GLOVE) ×3 IMPLANT
GLOVE BIOGEL PI IND STRL 6.5 (GLOVE) IMPLANT
GLOVE BIOGEL PI IND STRL 8 (GLOVE) ×1 IMPLANT
GLOVE BIOGEL PI INDICATOR 6.5 (GLOVE)
GLOVE BIOGEL PI INDICATOR 8 (GLOVE) ×2
GLOVE SURG SS PI 6.5 STRL IVOR (GLOVE) IMPLANT
GOWN STRL REUS W/TWL LRG LVL3 (GOWN DISPOSABLE) ×3 IMPLANT
GOWN STRL REUS W/TWL XL LVL3 (GOWN DISPOSABLE) IMPLANT
HANDPIECE INTERPULSE COAX TIP (DISPOSABLE) ×3
IMMOBILIZER KNEE 20 (SOFTGOODS) ×2 IMPLANT
IMMOBILIZER KNEE 20 THIGH 36 (SOFTGOODS) ×1 IMPLANT
KIT BASIN OR (CUSTOM PROCEDURE TRAY) ×3 IMPLANT
MANIFOLD NEPTUNE II (INSTRUMENTS) ×3 IMPLANT
NDL SAFETY ECLIPSE 18X1.5 (NEEDLE) ×2 IMPLANT
NEEDLE HYPO 18GX1.5 SHARP (NEEDLE) ×6
NS IRRIG 1000ML POUR BTL (IV SOLUTION) ×3 IMPLANT
PACK TOTAL JOINT (CUSTOM PROCEDURE TRAY) ×3 IMPLANT
PAD ABD 8X10 STRL (GAUZE/BANDAGES/DRESSINGS) ×2 IMPLANT
PADDING CAST COTTON 6X4 STRL (CAST SUPPLIES) ×4 IMPLANT
POSITIONER SURGICAL ARM (MISCELLANEOUS) ×3 IMPLANT
SET HNDPC FAN SPRY TIP SCT (DISPOSABLE) ×1 IMPLANT
STRIP CLOSURE SKIN 1/2X4 (GAUZE/BANDAGES/DRESSINGS) ×3 IMPLANT
SUCTION FRAZIER 12FR DISP (SUCTIONS) ×3 IMPLANT
SUT MNCRL AB 4-0 PS2 18 (SUTURE) ×3 IMPLANT
SUT VIC AB 2-0 CT1 27 (SUTURE) ×9
SUT VIC AB 2-0 CT1 TAPERPNT 27 (SUTURE) ×3 IMPLANT
SUT VLOC 180 0 24IN GS25 (SUTURE) ×3 IMPLANT
SYR 20CC LL (SYRINGE) ×3 IMPLANT
SYR 50ML LL SCALE MARK (SYRINGE) ×3 IMPLANT
TOWEL OR 17X26 10 PK STRL BLUE (TOWEL DISPOSABLE) ×3 IMPLANT
TOWEL OR NON WOVEN STRL DISP B (DISPOSABLE) IMPLANT
TRAY FOLEY CATH 14FRSI W/METER (CATHETERS) ×1 IMPLANT
TRAY FOLEY CATH 16FRSI W/METER (SET/KITS/TRAYS/PACK) ×2 IMPLANT
WATER STERILE IRR 1500ML POUR (IV SOLUTION) ×3 IMPLANT
WRAP KNEE MAXI GEL POST OP (GAUZE/BANDAGES/DRESSINGS) ×3 IMPLANT

## 2014-08-30 NOTE — Op Note (Signed)
Pre-operative diagnosis- Osteoarthritis  Left knee(s)  Post-operative diagnosis- Osteoarthritis Left knee(s)  Procedure-  Left  Total Knee Arthroplasty  Surgeon- Gus RankinFrank V. Bertis Hustead, MD  Assistant- Avel Peacerew Perkins, PA-C   Anesthesia-  Spinal  EBL-* No blood loss amount entered *   Drains Hemovac  Tourniquet time-  Total Tourniquet Time Documented: Thigh (Left) - 33 minutes Total: Thigh (Left) - 33 minutes     Complications- None  Condition-PACU - hemodynamically stable.   Brief Clinical Note   Ryan JacquetRaymond D Meza is a 73 y.o. year old male with end stage OA of his left knee with progressively worsening pain and dysfunction. He has constant pain, with activity and at rest and significant functional deficits with difficulties even with ADLs. He has had extensive non-op management including analgesics, injections of cortisone and viscosupplements, and home exercise program, but remains in significant pain with significant dysfunction. Radiographs show bone on bone arthritis medial and patellofemoral with a severe varus deformity clinically. He presents now for left Total Knee Arthroplasty.     Procedure in detail---   The patient is brought into the operating room and positioned supine on the operating table. After successful administration of  Spinal,   a tourniquet is placed high on the  Left thigh(s) and the lower extremity is prepped and draped in the usual sterile fashion. Time out is performed by the operating team and then the  Left lower extremity is wrapped in Esmarch, knee flexed and the tourniquet inflated to 300 mmHg.       A midline incision is made with a ten blade through the subcutaneous tissue to the level of the extensor mechanism. A fresh blade is used to make a medial parapatellar arthrotomy. Soft tissue over the proximal medial tibia is subperiosteally elevated to the joint line with a knife and into the semimembranosus bursa with a Cobb elevator. Soft tissue over the  proximal lateral tibia is elevated with attention being paid to avoiding the patellar tendon on the tibial tubercle. The patella is everted, knee flexed 90 degrees and the ACL and PCL are removed. Findings are bone on bone medial and patellofemoral with massive global osteophytes.        The drill is used to create a starting hole in the distal femur and the canal is thoroughly irrigated with sterile saline to remove the fatty contents. The 5 degree Left  valgus alignment guide is placed into the femoral canal and the distal femoral cutting block is pinned to remove 10 mm off the distal femur. Resection is made with an oscillating saw.      The tibia is subluxed forward and the menisci are removed. The extramedullary alignment guide is placed referencing proximally at the medial aspect of the tibial tubercle and distally along the second metatarsal axis and tibial crest. The block is pinned to remove 2mm off the more deficient medial  side. Resection is made with an oscillating saw. Size 5is the most appropriate size for the tibia and the proximal tibia is prepared with the modular drill and keel punch for that size.      The femoral sizing guide is placed and size 5 is most appropriate. Rotation is marked off the epicondylar axis and confirmed by creating a rectangular flexion gap at 90 degrees. The size 5 cutting block is pinned in this rotation and the anterior, posterior and chamfer cuts are made with the oscillating saw. The intercondylar block is then placed and that cut is made.  Trial size 5 tibial component, trial size 5 posterior stabilized femur and a 12.5  mm posterior stabilized rotating platform insert trial is placed. Full extension is achieved with excellent varus/valgus and anterior/posterior balance throughout full range of motion. The patella is everted and thickness measured to be 27  mm. Free hand resection is taken to 15 mm, a 41 template is placed, lug holes are drilled, trial patella  is placed, and it tracks normally. Osteophytes are removed off the posterior femur with the trial in place. All trials are removed and the cut bone surfaces prepared with pulsatile lavage. Cement is mixed and once ready for implantation, the size 5 tibial implant, size  5 posterior stabilized femoral component, and the size 41 patella are cemented in place and the patella is held with the clamp. The trial insert is placed and the knee held in full extension. The Exparel (20 ml mixed with 30 ml saline) and .25% Bupivicaine, are injected into the extensor mechanism, posterior capsule, medial and lateral gutters and subcutaneous tissues.  All extruded cement is removed and once the cement is hard the permanent 12.5 mm posterior stabilized rotating platform insert is placed into the tibial tray.      The wound is copiously irrigated with saline solution and the extensor mechanism closed over a hemovac drain with #1 V-loc suture. The tourniquet is released for a total tourniquet time of 33  minutes. Flexion against gravity is 140 degrees and the patella tracks normally. Subcutaneous tissue is closed with 2.0 vicryl and subcuticular with running 4.0 Monocryl. The incision is cleaned and dried and steri-strips and a bulky sterile dressing are applied. The limb is placed into a knee immobilizer and the patient is awakened and transported to recovery in stable condition.      Please note that a surgical assistant was a medical necessity for this procedure in order to perform it in a safe and expeditious manner. Surgical assistant was necessary to retract the ligaments and vital neurovascular structures to prevent injury to them and also necessary for proper positioning of the limb to allow for anatomic placement of the prosthesis.   Gus RankinFrank V. Silvester Reierson, MD    08/30/2014, 2:47 PM

## 2014-08-30 NOTE — Progress Notes (Signed)
Utilization review completed.  

## 2014-08-30 NOTE — Interval H&P Note (Signed)
History and Physical Interval Note:  08/30/2014 1:35 PM  Ryan Meza  has presented today for surgery, with the diagnosis of OA OF Left KNEE  The various methods of treatment have been discussed with the patient and family. After consideration of risks, benefits and other options for treatment, the patient has consented to  Procedure(s): Left TOTAL KNEE ARTHROPLASTY (Left) as a surgical intervention .  The patient's history has been reviewed, patient examined, no change in status, stable for surgery.  I have reviewed the patient's chart and labs.  Questions were answered to the patient's satisfaction.     Loanne DrillingALUISIO,Pawan Knechtel V

## 2014-08-30 NOTE — Transfer of Care (Signed)
Immediate Anesthesia Transfer of Care Note  Patient: Ryan Meza  Procedure(s) Performed: Procedure(s) (LRB): Left TOTAL KNEE ARTHROPLASTY (Left)  Patient Location: PACU  Anesthesia Type: Spinal  Level of Consciousness: sedated, patient cooperative and responds to stimulation  Airway & Oxygen Therapy: Patient Spontanous Breathing and Patient connected to face mask oxgen  Post-op Assessment: Report given to PACU RN and Post -op Vital signs reviewed and stable  Post vital signs: Reviewed and stable  Complications: No apparent anesthesia complications

## 2014-08-30 NOTE — Plan of Care (Signed)
Problem: Consults Goal: Total Joint Replacement Patient Education See Patient Education Module for education specifics. Outcome: Progressing Goal: Diagnosis- Total Joint Replacement Primary Total Knee Goal: Skin Care Protocol Initiated - if Braden Score 18 or less If consults are not indicated, leave blank or document N/A Outcome: Not Applicable Date Met:  57/90/38 Goal: Nutrition Consult-if indicated Outcome: Not Applicable Date Met:  33/38/32 Goal: Diabetes Guidelines if Diabetic/Glucose > 140 If diabetic or lab glucose is > 140 mg/dl - Initiate Diabetes/Hyperglycemia Guidelines & Document Interventions  Outcome: Not Applicable Date Met:  91/91/66  Problem: Phase I Progression Outcomes Goal: CMS/Neurovascular status WDL Outcome: Progressing Goal: Pain controlled with appropriate interventions Outcome: Progressing

## 2014-08-30 NOTE — Anesthesia Postprocedure Evaluation (Signed)
  Anesthesia Post-op Note  Patient: Ryan Meza  Procedure(s) Performed: Procedure(s): Left TOTAL KNEE ARTHROPLASTY (Left)  Patient Location: PACU  Anesthesia Type: Spinal/MAC  Level of Consciousness: awake and alert   Airway and Oxygen Therapy: Patient Spontanous Breathing  Post-op Pain: moderate  Post-op Assessment: Post-op Vital signs reviewed, Patient's Cardiovascular Status Stable and Respiratory Function Stable. No residual motor block.  Post-op Vital Signs: Reviewed  Filed Vitals:   08/30/14 1515  BP:   Pulse: 53  Temp: 36.4 C  Resp: 16    Complications: No apparent anesthesia complications

## 2014-08-30 NOTE — Anesthesia Procedure Notes (Signed)
Spinal Patient location during procedure: OR Start time: 08/30/2014 1:41 PM End time: 08/30/2014 1:48 PM Staffing Anesthesiologist: Gaynelle AduFITZGERALD, Wilbur [place] E Performed by: anesthesiologist  Preanesthetic Checklist Completed: patient identified, surgical consent, pre-op evaluation, timeout performed, IV checked, risks and benefits discussed and monitors and equipment checked Spinal Block Patient position: sitting Prep: Betadine Patient monitoring: cardiac monitor, continuous pulse ox and blood pressure Approach: midline Location: L3-4 Injection technique: single-shot Needle Needle type: Sprotte  Needle gauge: 24 G Needle length: 9 cm Assessment Sensory level: T10 Additional Notes Functioning IV was confirmed and monitors were applied. Sterile prep and drape, including hand hygiene and sterile gloves were used. The patient was positioned and the spine was prepped. The skin was anesthetized with lidocaine.  Free flow of clear CSF was obtained prior to injecting local anesthetic into the CSF.  The spinal needle aspirated freely following injection.  The needle was carefully withdrawn.  The patient tolerated the procedure well.

## 2014-08-30 NOTE — Anesthesia Preprocedure Evaluation (Signed)
Anesthesia Evaluation  Patient identified by MRN, date of birth, ID band Patient awake    Reviewed: Allergy & Precautions, H&P , NPO status , Patient's Chart, lab work & pertinent test results  Airway Mallampati: II  TM Distance: >3 FB Neck ROM: Full    Dental no notable dental hx. (+) Edentulous Upper, Edentulous Lower, Dental Advisory Given   Pulmonary neg pulmonary ROS, former smoker,  breath sounds clear to auscultation  Pulmonary exam normal       Cardiovascular hypertension, Pt. on medications Rhythm:Regular Rate:Normal     Neuro/Psych negative neurological ROS  negative psych ROS   GI/Hepatic Neg liver ROS, GERD-  Medicated and Controlled,  Endo/Other  negative endocrine ROS  Renal/GU negative Renal ROS  negative genitourinary   Musculoskeletal   Abdominal   Peds  Hematology negative hematology ROS (+)   Anesthesia Other Findings   Reproductive/Obstetrics negative OB ROS                             Anesthesia Physical Anesthesia Plan  ASA: II  Anesthesia Plan: MAC and Spinal   Post-op Pain Management:    Induction: Intravenous  Airway Management Planned: Simple Face Mask  Additional Equipment:   Intra-op Plan:   Post-operative Plan:   Informed Consent: I have reviewed the patients History and Physical, chart, labs and discussed the procedure including the risks, benefits and alternatives for the proposed anesthesia with the patient or authorized representative who has indicated his/her understanding and acceptance.   Dental advisory given  Plan Discussed with: CRNA  Anesthesia Plan Comments:         Anesthesia Quick Evaluation

## 2014-08-30 NOTE — H&P (View-Only) (Signed)
Ryan Meza DOB: 1941-03-09 Married / Language: English / Race: White Male Date of Admission:  08-30-2014 Chief Complaint:  Left Knee Pain History of Present Illness The patient is a 73 year old male who comes in for a preoperative history and physical. The patient is scheduled for a left total knee arthroplasty to be performed by Dr. Gus RankinFrank V. Aluisio, [facility] on 08/30/2014. The patient is seen for bilateral knee problems. The patient reports left knee and right knee symptoms including: pain . He states the knee has been bothering him for a long time. He has pain with most activities including at rest. He has significant deformity in both knees and he is concerned that is making him want to fall. He is concerned about injuring himself in a fall. He is not having hip pain with this.  He is ready to get the knees fixed. They have been treated conservatively in the past for the above stated problem and despite conservative measures, they continue to have progressive pain and severe functional limitations and dysfunction. They have failed non-operative management including home exercise, medications. It is felt that they would benefit from undergoing total joint replacement. Risks and benefits of the procedure have been discussed with the patient and they elect to proceed with surgery. There are no active contraindications to surgery such as ongoing infection or rapidly progressive neurological disease.  Problem List/Past Medical Primary osteoarthritis of knees High blood pressure Hypercholesterolemia Glaucoma Cataract  Allergies  No Known Drug Allergies  Family History  Congestive Heart Failure Father.  Social History  No history of drug/alcohol rehab Not under pain contract Marital status married Never consumed alcohol 04/27/2014: Never consumed alcohol Number of flights of stairs before winded 2-3 Tobacco / smoke exposure 04/27/2014: no Tobacco use  Former smoker. 04/27/2014: smoke(d) 1 1/2 pack(s) per day Living situation live with spouse Current work status retired Exercise Exercises rarely; does other Children 2  Medication History Combigan (0.2-0.5% Solution, Ophthalmic) Active. Lumigan (0.01% Solution, Ophthalmic) Active. Atorvastatin Calcium (80MG  Tablet, Oral) Active. Lisinopril (5MG  Tablet, Oral) Active. Omeprazole (20MG  Tablet DR, Oral) Active. Vitamin D3 (1000UNIT Tablet, Oral) Active. Multivitamin (Oral) Active. Aspirin EC Low Strength (81MG  Tablet DR, Oral) Active. Fiber (Oral) Active.  Past Surgical History Cataract Surgery right Prostatectomy; Transurethral  Review of Systems  General Not Present- Chills, Fatigue, Fever, Memory Loss, Night Sweats, Weight Gain and Weight Loss. Skin Not Present- Eczema, Hives, Itching, Lesions and Rash. HEENT Not Present- Dentures, Double Vision, Headache, Hearing Loss, Tinnitus and Visual Loss. Respiratory Not Present- Allergies, Chronic Cough, Coughing up blood, Shortness of breath at rest and Shortness of breath with exertion. Cardiovascular Not Present- Chest Pain, Difficulty Breathing Lying Down, Murmur, Palpitations, Racing/skipping heartbeats and Swelling. Gastrointestinal Not Present- Abdominal Pain, Bloody Stool, Constipation, Diarrhea, Difficulty Swallowing, Heartburn, Jaundice, Loss of appetitie, Nausea and Vomiting. Male Genitourinary Not Present- Blood in Urine, Discharge, Flank Pain, Incontinence, Painful Urination, Urgency, Urinary frequency, Urinary Retention, Urinating at Night and Weak urinary stream. Musculoskeletal Present- Joint Pain. Not Present- Back Pain, Joint Swelling, Morning Stiffness, Muscle Pain, Muscle Weakness and Spasms. Neurological Not Present- Blackout spells, Difficulty with balance, Dizziness, Paralysis, Tremor and Weakness. Psychiatric Not Present- Insomnia  Vitals  Weight: 178 lb Height: 72in Weight was reported by  patient. Height was reported by patient. Body Surface Area: 2.03 m Body Mass Index: 24.14 kg/m  BP: 140/74 (Sitting, Right Arm, Standard)   Physical Exam General Mental Status -Alert, cooperative and good historian. General Appearance-pleasant, Not  in acute distress. Orientation-Oriented X3. Build & Nutrition-Well nourished and Well developed.  Head and Neck Head-normocephalic, atraumatic . Neck Global Assessment - supple, no bruit auscultated on the right, no bruit auscultated on the left. Note: full upper and lower dentures   Eye Vision-Wears corrective lenses. Pupil - Bilateral-Regular and Round. Motion - Bilateral-EOMI.  Chest and Lung Exam Auscultation Breath sounds - clear at anterior chest wall and clear at posterior chest wall. Adventitious sounds - No Adventitious sounds.  Cardiovascular Auscultation Rhythm - Regular rate and rhythm. Heart Sounds - S1 WNL and S2 WNL. Murmurs & Other Heart Sounds - Auscultation of the heart reveals - No Murmurs.  Abdomen Palpation/Percussion Tenderness - Abdomen is non-tender to palpation. Rigidity (guarding) - Abdomen is soft. Auscultation Auscultation of the abdomen reveals - Bowel sounds normal.  Male Genitourinary Note: Not done, not pertinent to present illness   Musculoskeletal Note: On exam, well developed male alert and oriented in no apparent distress. His hips show normal range of motion with no discomfort. The left knee shows significant varus deformity. Range is about 5-125. There is no tenderness or instability on that side. His right knee has severe varus. Range is 5-130. Marked crepitus on range of motion. Tender medial greater than lateral with no instability.   Assessment & Plan  Primary localized osteoarthritis of right knee (M17.11) Primary osteoarthritis of knee, left Note:Plan is for a Left Total Knee Replacement by Dr. Lequita HaltAluisio.  Plan is to go home  PCP - Dr. Rodrigo RanMark  Perini - Patient has been seen preoperatively and felt to be stable for surgery.  The patient does not have any contraindications and will receive TXA (tranexamic acid) prior to surgery.  Signed electronically by Lauraine RinneAlexzandrew L Kimiah Hibner, III PA-C

## 2014-08-31 ENCOUNTER — Encounter (HOSPITAL_COMMUNITY): Payer: Self-pay | Admitting: Orthopedic Surgery

## 2014-08-31 LAB — BASIC METABOLIC PANEL
ANION GAP: 11 (ref 5–15)
BUN: 16 mg/dL (ref 6–23)
CALCIUM: 9 mg/dL (ref 8.4–10.5)
CO2: 23 meq/L (ref 19–32)
Chloride: 99 mEq/L (ref 96–112)
Creatinine, Ser: 0.88 mg/dL (ref 0.50–1.35)
GFR calc Af Amer: >90 mL/min (ref >90)
GFR, EST NON AFRICAN AMERICAN: 82 mL/min — AB (ref >90)
GLUCOSE: 157 mg/dL — AB (ref 70–99)
POTASSIUM: 4.6 meq/L (ref 3.7–5.3)
SODIUM: 133 meq/L — AB (ref 137–147)

## 2014-08-31 LAB — CBC
HCT: 36.1 % — ABNORMAL LOW (ref 39.0–52.0)
HEMOGLOBIN: 12.3 g/dL — AB (ref 13.0–17.0)
MCH: 29.9 pg (ref 26.0–34.0)
MCHC: 34.1 g/dL (ref 30.0–36.0)
MCV: 87.6 fL (ref 78.0–100.0)
PLATELETS: 200 10*3/uL (ref 150–400)
RBC: 4.12 MIL/uL — AB (ref 4.22–5.81)
RDW: 12.1 % (ref 11.5–15.5)
WBC: 12.4 10*3/uL — ABNORMAL HIGH (ref 4.0–10.5)

## 2014-08-31 MED ORDER — OXYCODONE HCL 5 MG PO TABS: 5.0000 mg | ORAL_TABLET | ORAL | Status: DC | PRN

## 2014-08-31 MED ORDER — METHOCARBAMOL 500 MG PO TABS: 500.0000 mg | ORAL_TABLET | Freq: Four times a day (QID) | ORAL | Status: DC | PRN

## 2014-08-31 MED ORDER — TRAMADOL HCL 50 MG PO TABS: 50.0000 mg | ORAL_TABLET | Freq: Four times a day (QID) | ORAL | Status: DC | PRN

## 2014-08-31 MED ORDER — ESOMEPRAZOLE MAGNESIUM 20 MG PO CPDR
20.0000 mg | DELAYED_RELEASE_CAPSULE | Freq: Every day | ORAL | Status: DC
  Administered 2014-09-01: 20 mg via ORAL
  Filled 2014-08-31: qty 1

## 2014-08-31 MED ORDER — RIVAROXABAN 10 MG PO TABS: 10.0000 mg | ORAL_TABLET | Freq: Every day | ORAL | Status: DC

## 2014-08-31 NOTE — Evaluation (Signed)
Occupational Therapy Evaluation Patient Details Name: Ryan JacquetRaymond D Wente MRN: [id number] DOB: [birth year] Today's Date: 08/31/2014    History of Present Illness Pt is a 73 year old male s/p L TKA   Clinical Impression   Pt was admitted for L TKA.  All education was completed.  No further OT is needed at this time.      Follow Up Recommendations  No OT follow up;Supervision/Assistance - 24 hour    Equipment Recommendations  None recommended by OT    Recommendations for Other Services       Precautions / Restrictions Precautions Precautions: Knee Required Braces or Orthoses: Knee Immobilizer - Left Knee Immobilizer - Left: Discontinue once straight leg raise with < 10 degree lag Restrictions Other Position/Activity Restrictions: WBAT      Mobility Bed Mobility Overal bed mobility: Needs Assistance Bed Mobility: Supine to Sit;Sit to Supine     Supine to sit: Min assist Sit to supine: Min assist   General bed mobility comments: assist with LLE  Transfers   Equipment used: Rolling walker (2 wheeled) Transfers: Sit to/from Stand Sit to Stand: Min assist         General transfer comment: light min A for steadying    Balance                                            ADL Overall ADL's : Needs assistance/impaired     Grooming: Supervision/safety;Standing       Lower Body Bathing: Minimal assistance;Sit to/from stand       Lower Body Dressing: Moderate assistance;Sit to/from stand   Toilet Transfer: Minimal assistance;BSC;Ambulation   Toileting- ArchitectClothing Manipulation and Hygiene: Min guard;Sit to/from stand   Tub/ Shower Transfer: Minimal assistance;Walk-in shower;Ambulation     General ADL Comments: pt's wife will assist him.  He is borrowing a reacher:  reviewed ADL uses.  Pt has a high bed and may need a stool to step backwards on.  He practiced with sheet lifting leg into bed earlier with PT     Vision                      Perception     Praxis      Pertinent Vitals/Pain Pain Assessment: 0-10 Pain Score: 3  Pain Location: L knee Pain Descriptors / Indicators: Aching;Sore Pain Intervention(s): Limited activity within patient's tolerance;Monitored during session;Premedicated before session;Ice applied     Hand Dominance     Extremity/Trunk Assessment Upper Extremity Assessment Upper Extremity Assessment: Overall WFL for tasks assessed          Communication Communication Communication: No difficulties   Cognition Arousal/Alertness: Awake/alert Behavior During Therapy: WFL for tasks assessed/performed Overall Cognitive Status: Within Functional Limits for tasks assessed                     General Comments       Exercises       Shoulder Instructions      Home Living Family/patient expects to be discharged to:: Private residence Living Arrangements: Spouse/significant other   Type of Home: House Home Access: Stairs to enter Secretary/administratorntrance Stairs-Number of Steps: 2 Entrance Stairs-Rails: None Home Layout: One level     Bathroom Shower/Tub: Producer, television/film/videoWalk-in shower   Bathroom Toilet: Standard     Home Equipment: None   Additional Comments: will borrow 3;1 and  reacher from friend      Prior Functioning/Environment Level of Independence: Independent             OT Diagnosis: Generalized weakness   OT Problem List:     OT Treatment/Interventions:      OT Goals(Current goals can be found in the care plan section)    OT Frequency:     Barriers to D/C:            Co-evaluation              End of Session    Activity Tolerance: Patient tolerated treatment well Patient left: in bed;with call bell/phone within reach   Time: 1130-1150 OT Time Calculation (min): 20 min Charges:  OT General Charges $OT Visit: 1 Procedure OT Evaluation $Initial OT Evaluation Tier I: 1 Procedure OT Treatments $Self Care/Home Management : 8-22 mins G-Codes:     Kemani Demarais 08/31/2014, 12:16 PM  Marica OtterMaryellen Elysse Polidore, OTR/L [phone] 08/31/2014

## 2014-08-31 NOTE — Plan of Care (Signed)
Problem: Phase I Progression Outcomes Goal: Pain controlled with appropriate interventions Outcome: Completed/Met Date Met:  08/31/14     

## 2014-08-31 NOTE — Plan of Care (Signed)
Problem: Phase I Progression Outcomes Goal: Dangle or out of bed evening of surgery Outcome: Completed/Met Date Met:  08/31/14

## 2014-08-31 NOTE — Plan of Care (Signed)
Problem: Phase I Progression Outcomes Goal: Other Phase I Outcomes/Goals Outcome: Completed/Met Date Met:  08/31/14     

## 2014-08-31 NOTE — Plan of Care (Signed)
Problem: Phase II Progression Outcomes Goal: Ambulates Outcome: Progressing Goal: Tolerating diet Outcome: Progressing     

## 2014-08-31 NOTE — Discharge Summary (Signed)
Physician Discharge Summary   Patient ID: Ryan Meza MRN: [id number] DOB/AGE: Jun 11, 1941 73 y.o.  Admit date: 08/30/2014 Discharge date: 09/01/2014  Primary Diagnosis:  Osteoarthritis Left knee(s)  Admission Diagnoses:  Past Medical History  Diagnosis Date  . Hyperlipidemia     under control  . GERD (gastroesophageal reflux disease)   . Wears glasses   . Full dentures   . Arthritis   . Double vision     occasional  . Glaucoma     right eye  . Hypertension     under control  . History of blood transfusion 2013    2 unit, no reaction   Discharge Diagnoses:   Principal Problem:   OA (osteoarthritis) of knee  Estimated body mass index is 24.81 kg/(m^2) as calculated from the following:   Height as of this encounter: 6' (1.829 m).   Weight as of this encounter: 83.008 kg (183 lb).  Procedure:  Procedure(s) (LRB): Left TOTAL KNEE ARTHROPLASTY (Left)   Consults: None  HPI: Ryan Meza is a 73 y.o. year old male with end stage OA of his left knee with progressively worsening pain and dysfunction. He has constant pain, with activity and at rest and significant functional deficits with difficulties even with ADLs. He has had extensive non-op management including analgesics, injections of cortisone and viscosupplements, and home exercise program, but remains in significant pain with significant dysfunction. Radiographs show bone on bone arthritis medial and patellofemoral with a severe varus deformity clinically. He presents now for left Total Knee Arthroplasty.   Laboratory Data: Admission on 08/30/2014, Discharged on 09/01/2014  Component Date Value Ref Range Status  . ABO/RH(D) 08/30/2014 O POS   Final  . Antibody Screen 08/30/2014 NEG   Final  . Sample Expiration 08/30/2014 09/02/2014   Final  . WBC 08/31/2014 12.4* 4.0 - 10.5 K/uL Final  . RBC 08/31/2014 4.12* 4.22 - 5.81 MIL/uL Final  . Hemoglobin 08/31/2014 12.3* 13.0 - 17.0 g/dL Final  . HCT 08/31/2014  36.1* 39.0 - 52.0 % Final  . MCV 08/31/2014 87.6  78.0 - 100.0 fL Final  . MCH 08/31/2014 29.9  26.0 - 34.0 pg Final  . MCHC 08/31/2014 34.1  30.0 - 36.0 g/dL Final  . RDW 08/31/2014 12.1  11.5 - 15.5 % Final  . Platelets 08/31/2014 200  150 - 400 K/uL Final  . Sodium 08/31/2014 133* 137 - 147 mEq/L Final  . Potassium 08/31/2014 4.6  3.7 - 5.3 mEq/L Final  . Chloride 08/31/2014 99  96 - 112 mEq/L Final  . CO2 08/31/2014 23  19 - 32 mEq/L Final  . Glucose, Bld 08/31/2014 157* 70 - 99 mg/dL Final  . BUN 08/31/2014 16  6 - 23 mg/dL Final  . Creatinine, Ser 08/31/2014 0.88  0.50 - 1.35 mg/dL Final  . Calcium 08/31/2014 9.0  8.4 - 10.5 mg/dL Final  . GFR calc non Af Amer 08/31/2014 82* >90 mL/min Final  . GFR calc Af Amer 08/31/2014 >90  >90 mL/min Final   Comment: (NOTE) The eGFR has been calculated using the CKD EPI equation. This calculation has not been validated in all clinical situations. eGFR's persistently <90 mL/min signify possible Chronic Kidney Disease.   . Anion gap 08/31/2014 11  5 - 15 Final  . WBC 09/01/2014 13.4* 4.0 - 10.5 K/uL Final  . RBC 09/01/2014 3.85* 4.22 - 5.81 MIL/uL Final  . Hemoglobin 09/01/2014 11.4* 13.0 - 17.0 g/dL Final  . HCT 09/01/2014 34.2* 39.0 - 52.0 %  Final  . MCV 09/01/2014 88.8  78.0 - 100.0 fL Final  . MCH 09/01/2014 29.6  26.0 - 34.0 pg Final  . MCHC 09/01/2014 33.3  30.0 - 36.0 g/dL Final  . RDW 09/01/2014 12.5  11.5 - 15.5 % Final  . Platelets 09/01/2014 207  150 - 400 K/uL Final  . Sodium 09/01/2014 139  137 - 147 mEq/L Final  . Potassium 09/01/2014 4.0  3.7 - 5.3 mEq/L Final  . Chloride 09/01/2014 103  96 - 112 mEq/L Final  . CO2 09/01/2014 24  19 - 32 mEq/L Final  . Glucose, Bld 09/01/2014 133* 70 - 99 mg/dL Final  . BUN 09/01/2014 18  6 - 23 mg/dL Final  . Creatinine, Ser 09/01/2014 1.01  0.50 - 1.35 mg/dL Final  . Calcium 09/01/2014 9.1  8.4 - 10.5 mg/dL Final  . GFR calc non Af Amer 09/01/2014 71* >90 mL/min Final  . GFR calc Af  Amer 09/01/2014 82* >90 mL/min Final   Comment: (NOTE) The eGFR has been calculated using the CKD EPI equation. This calculation has not been validated in all clinical situations. eGFR's persistently <90 mL/min signify possible Chronic Kidney Disease.   Georgiann Hahn gap 09/01/2014 12  5 - 15 Final  Hospital Outpatient Visit on 08/24/2014  Component Date Value Ref Range Status  . MRSA, PCR 08/24/2014 NEGATIVE  NEGATIVE Final  . Staphylococcus aureus 08/24/2014 POSITIVE* NEGATIVE Final   Comment:                                 The Xpert SA Assay (FDA                          approved for NASAL specimens                          in patients over 57 years of age),                          is one component of                          a comprehensive surveillance                          program.  Test performance has                          been validated by [employer] for patients greater                          than or equal to 94 year old.                          It is not intended                          to diagnose infection nor to  guide or monitor treatment.  Marland Kitchen aPTT 08/24/2014 31  24 - 37 seconds Final  . WBC 08/24/2014 7.2  4.0 - 10.5 K/uL Final  . RBC 08/24/2014 4.72  4.22 - 5.81 MIL/uL Final  . Hemoglobin 08/24/2014 14.0  13.0 - 17.0 g/dL Final  . HCT 08/24/2014 42.5  39.0 - 52.0 % Final  . MCV 08/24/2014 90.0  78.0 - 100.0 fL Final  . MCH 08/24/2014 29.7  26.0 - 34.0 pg Final  . MCHC 08/24/2014 32.9  30.0 - 36.0 g/dL Final  . RDW 08/24/2014 12.4  11.5 - 15.5 % Final  . Platelets 08/24/2014 209  150 - 400 K/uL Final  . Sodium 08/24/2014 140  137 - 147 mEq/L Final  . Potassium 08/24/2014 4.9  3.7 - 5.3 mEq/L Final  . Chloride 08/24/2014 103  96 - 112 mEq/L Final  . CO2 08/24/2014 27  19 - 32 mEq/L Final  . Glucose, Bld 08/24/2014 119* 70 - 99 mg/dL Final  . BUN 08/24/2014 14  6 - 23 mg/dL Final  . Creatinine, Ser  08/24/2014 1.08  0.50 - 1.35 mg/dL Final  . Calcium 08/24/2014 9.8  8.4 - 10.5 mg/dL Final  . Total Protein 08/24/2014 7.5  6.0 - 8.3 g/dL Final  . Albumin 08/24/2014 4.0  3.5 - 5.2 g/dL Final  . AST 08/24/2014 21  0 - 37 U/L Final  . ALT 08/24/2014 17  0 - 53 U/L Final  . Alkaline Phosphatase 08/24/2014 103  39 - 117 U/L Final  . Total Bilirubin 08/24/2014 0.5  0.3 - 1.2 mg/dL Final  . GFR calc non Af Amer 08/24/2014 65* >90 mL/min Final  . GFR calc Af Amer 08/24/2014 76* >90 mL/min Final   Comment: (NOTE)                          The eGFR has been calculated using the CKD EPI equation.                          This calculation has not been validated in all clinical situations.                          eGFR's persistently <90 mL/min signify possible Chronic Kidney                          Disease.  . Anion gap 08/24/2014 10  5 - 15 Final  . Prothrombin Time 08/24/2014 13.7  11.6 - 15.2 seconds Final  . INR 08/24/2014 1.04  0.00 - 1.49 Final  . Color, Urine 08/24/2014 YELLOW  YELLOW Final  . APPearance 08/24/2014 CLEAR  CLEAR Final  . Specific Gravity, Urine 08/24/2014 1.007  1.005 - 1.030 Final  . pH 08/24/2014 6.5  5.0 - 8.0 Final  . Glucose, UA 08/24/2014 NEGATIVE  NEGATIVE mg/dL Final  . Hgb urine dipstick 08/24/2014 NEGATIVE  NEGATIVE Final  . Bilirubin Urine 08/24/2014 NEGATIVE  NEGATIVE Final  . Ketones, ur 08/24/2014 NEGATIVE  NEGATIVE mg/dL Final  . Protein, ur 08/24/2014 NEGATIVE  NEGATIVE mg/dL Final  . Urobilinogen, UA 08/24/2014 0.2  0.0 - 1.0 mg/dL Final  . Nitrite 08/24/2014 NEGATIVE  NEGATIVE Final  . Leukocytes, UA 08/24/2014 NEGATIVE  NEGATIVE Final   MICROSCOPIC NOT DONE ON URINES WITH NEGATIVE PROTEIN, BLOOD, LEUKOCYTES, NITRITE, OR GLUCOSE <1000 mg/dL.  X-Rays:Dg Chest 2 View  08/30/2014   CLINICAL DATA:  Hypertension.  EXAM: CHEST  2 VIEW  COMPARISON:  None.  FINDINGS: Mediastinum and hilar structures are normal . Biapical pleural parenchymal thickening and  consistent with scarring. No pleural effusion or pneumothorax. Heart size normal.  IMPRESSION: Biapical no acute cardiopulmonary disease. Pleural parenchymal thickening consistent with scarring.   Electronically Signed   By: Marcello Moores  Register   On: 08/30/2014 13:02    EKG: Orders placed or performed during the hospital encounter of 06/04/14  . EKG 12 lead  . EKG 12 lead     Hospital Course: Ryan Meza is a 73 y.o. who was admitted to [facility]. They were brought to the operating room on 08/30/2014 and underwent Procedure(s): Left TOTAL KNEE ARTHROPLASTY.  Patient tolerated the procedure well and was later transferred to the recovery room and then to the orthopaedic floor for postoperative care.  They were given PO and IV analgesics for pain control following their surgery.  They were given 24 hours of postoperative antibiotics of  Anti-infectives    Start     Dose/Rate Route Frequency Ordered Stop   08/30/14 2000  ceFAZolin (ANCEF) IVPB 2 g/50 mL premix     2 g100 mL/hr over 30 Minutes Intravenous Every 6 hours 08/30/14 1647 08/31/14 0151   08/30/14 1033  ceFAZolin (ANCEF) IVPB 2 g/50 mL premix     2 g100 mL/hr over 30 Minutes Intravenous On call to O.R. 08/30/14 1033 08/30/14 1352     and started on DVT prophylaxis in the form of Xarelto.   PT and OT were ordered for total joint protocol.  Discharge planning consulted to help with postop disposition and equipment needs.  Patient had a good night on the evening of surgery.  They started to get up OOB with therapy on day one. Hemovac drain was pulled without difficulty.  Continued to work with therapy into day two.  Dressing was changed on day two and the incision was healing well. Patient was seen in rounds and was ready to go home.  Discharge home with home health Diet - Cardiac diet Follow up - in 2 weeks Activity - WBAT Disposition - Home Condition Upon Discharge - Good D/C Meds - See DC Summary DVT Prophylaxis - Xarelto  for a total of three weeks.      Discharge Instructions    Call MD / Call 911    Complete by:  As directed   If you experience chest pain or shortness of breath, CALL 911 and be transported to the hospital emergency room.  If you develope a fever above 101 F, pus (white drainage) or increased drainage or redness at the wound, or calf pain, call your surgeon's office.     Change dressing    Complete by:  As directed   Change dressing daily with sterile 4 x 4 inch gauze dressing and apply TED hose. Do not submerge the incision under water.     Constipation Prevention    Complete by:  As directed   Drink plenty of fluids.  Prune juice may be helpful.  You may use a stool softener, such as Colace (over the counter) 100 mg twice a day.  Use MiraLax (over the counter) for constipation as needed.     Diet - low sodium heart healthy    Complete by:  As directed      Discharge instructions    Complete by:  As directed  Pick up stool softner and laxative for home. Do not submerge incision under water. May shower. Continue to use ice for pain and swelling from surgery. Take Xarelto for two and a half more weeks, then discontinue Xarelto. Once the patient has completed the Xarelto, they may resume the 81 mg Aspirin.     Do not put a pillow under the knee. Place it under the heel.    Complete by:  As directed      Do not sit on low chairs, stoools or toilet seats, as it may be difficult to get up from low surfaces    Complete by:  As directed      Driving restrictions    Complete by:  As directed   No driving until released by the physician.     Increase activity slowly as tolerated    Complete by:  As directed      Lifting restrictions    Complete by:  As directed   No lifting until released by the physician.     Patient may shower    Complete by:  As directed   You may shower without a dressing once there is no drainage.  Do not wash over the wound.  If drainage remains, do not shower  until drainage stops.     TED hose    Complete by:  As directed   Use stockings (TED hose) for 3 weeks on both leg(s).  You may remove them at night for sleeping.     Weight bearing as tolerated    Complete by:  As directed             Medication List    STOP taking these medications        aspirin 81 MG tablet     cholecalciferol 1000 UNITS tablet  Commonly known as:  VITAMIN D     multivitamins ther. w/minerals Tabs tablet      TAKE these medications        acetaminophen 500 MG tablet  Commonly known as:  TYLENOL  Take 500-1,000 mg by mouth every 6 (six) hours as needed for mild pain.     atorvastatin 80 MG tablet  Commonly known as:  LIPITOR  Take 40-80 mg by mouth every evening. take 7m on mon, wed, fri, and 40 mg on all other days     COMBIGAN 0.2-0.5 % ophthalmic solution  Generic drug:  brimonidine-timolol  Place 1 drop into the right eye 2 (two) times daily.     FIBERCON PO  Take 2 tablets by mouth 4 (four) times daily.     lisinopril 5 MG tablet  Commonly known as:  PRINIVIL,ZESTRIL  Take 5 mg by mouth at bedtime.     LUMIGAN 0.01 % Soln  Generic drug:  bimatoprost  Place 1 drop into the right eye at bedtime.     methocarbamol 500 MG tablet  Commonly known as:  ROBAXIN  Take 1 tablet (500 mg total) by mouth every 6 (six) hours as needed for muscle spasms.     omeprazole 20 MG capsule  Commonly known as:  PRILOSEC  Take 20 mg by mouth every morning.     oxyCODONE 5 MG immediate release tablet  Commonly known as:  Oxy IR/ROXICODONE  Take 1-2 tablets (5-10 mg total) by mouth every 3 (three) hours as needed for moderate pain, severe pain or breakthrough pain.     rivaroxaban 10 MG Tabs tablet  Commonly known as:  XARELTO  -  Take 1 tablet (10 mg total) by mouth daily with breakfast. Take Xarelto for two and a half more weeks, then discontinue Xarelto.  - Once the patient has completed the Xarelto, they may resume the 81 mg Aspirin.     traMADol  50 MG tablet  Commonly known as:  ULTRAM  Take 1-2 tablets (50-100 mg total) by mouth every 6 (six) hours as needed for moderate pain.       Follow-up Information    Follow up with [facility].   Why:  home health physical therapy   Contact information:   [place] [address] [place] [address] [phone]       Follow up with Gearlean Alf, MD On 09/14/2014.   Specialty:  Orthopedic Surgery   Why:  Call office at [phone] to set up appointment on  Tuesday 09/14/2014   Contact information:   [address] [place] [address] [phone]       Signed: Arlee Muslim, PA-C Orthopaedic Surgery 09/02/2014, 10:14 AM

## 2014-08-31 NOTE — Evaluation (Signed)
Physical Therapy Evaluation Patient Details Name: Ryan JacquetRaymond D Welshans MRN: [id number] DOB: Jan 21, 1941 Today's Date: 08/31/2014   History of Present Illness  Pt is a 73 year old male s/p L TKA  Clinical Impression  Pt is s/p L TKA resulting in the deficits listed below (see PT Problem List).  Pt will benefit from skilled PT to increase their independence and safety with mobility to allow discharge to the venue listed below.  Pt mobilizing well POD #1 and ambulated in hallway then performed LE exercises.  Pt plans to d/c home with spouse.     Follow Up Recommendations Home health PT    Equipment Recommendations  Rolling walker with 5" wheels    Recommendations for Other Services       Precautions / Restrictions Precautions Precautions: Knee Required Braces or Orthoses: Knee Immobilizer - Left Knee Immobilizer - Left: Discontinue once straight leg raise with < 10 degree lag Restrictions Other Position/Activity Restrictions: WBAT      Mobility  Bed Mobility Overal bed mobility: Needs Assistance Bed Mobility: Supine to Sit     Supine to sit: Min assist Sit to supine: Min assist   General bed mobility comments: assist with L LE  Transfers Overall transfer level: Needs assistance Equipment used: Rolling walker (2 wheeled) Transfers: Sit to/from Stand Sit to Stand: Min assist         General transfer comment: assist to rise and steady as pt wanted to pull up shorts without RW, had pt sit down again and educated on safe technique including UE and LE positioning  Ambulation/Gait Ambulation/Gait assistance: Min assist Ambulation Distance (Feet): 120 Feet Assistive device: Rolling walker (2 wheeled) Gait Pattern/deviations: Step-to pattern;Antalgic;Trunk flexed Gait velocity: decr   General Gait Details: verbal cues for sequence, technique, posture, step length  Stairs            Wheelchair Mobility    Modified Rankin (Stroke Patients Only)       Balance                                              Pertinent Vitals/Pain Pain Assessment: 0-10 Pain Score: 3  Pain Location: L knee Pain Descriptors / Indicators: Aching;Sore Pain Intervention(s): Limited activity within patient's tolerance;Monitored during session;Premedicated before session;Ice applied    Home Living Family/patient expects to be discharged to:: Private residence Living Arrangements: Spouse/significant other   Type of Home: House Home Access: Stairs to enter Entrance Stairs-Rails: None Secretary/administratorntrance Stairs-Number of Steps: 2 Home Layout: One level Home Equipment: None Additional Comments: will borrow 3;1 and reacher from friend    Prior Function Level of Independence: Independent               Hand Dominance        Extremity/Trunk Assessment   Upper Extremity Assessment: Overall WFL for tasks assessed           Lower Extremity Assessment: LLE deficits/detail   LLE Deficits / Details: unable to perform SLR, approx 80* AAROM L knee     Communication   Communication: No difficulties  Cognition Arousal/Alertness: Awake/alert Behavior During Therapy: WFL for tasks assessed/performed Overall Cognitive Status: Within Functional Limits for tasks assessed                      General Comments      Exercises Total Joint Exercises  Ankle Circles/Pumps: AROM;Both;15 reps Quad Sets: AROM;Both;15 reps Short Arc QuadBarbaraann Boys: AAROM;Left;15 reps Heel Slides: AAROM;Left;15 reps Hip ABduction/ADduction: AROM;Left;15 reps Straight Leg Raises: AAROM;Left;10 reps      Assessment/Plan    PT Assessment Patient needs continued PT services  PT Diagnosis Difficulty walking;Acute pain   PT Problem List Decreased strength;Decreased mobility;Decreased range of motion;Decreased knowledge of use of DME;Pain  PT Treatment Interventions Functional mobility training;Gait training;DME instruction;Patient/family education;Therapeutic  activities;Therapeutic exercise;Stair training   PT Goals (Current goals can be found in the Care Plan section) Acute Rehab PT Goals PT Goal Formulation: With patient Time For Goal Achievement: 09/03/14 Potential to Achieve Goals: Good    Frequency 7X/week   Barriers to discharge        Co-evaluation               End of Session Equipment Utilized During Treatment: Left knee immobilizer Activity Tolerance: Patient tolerated treatment well Patient left: in chair;with call bell/phone within reach;with family/visitor present           Time: 1002-1026 PT Time Calculation (min): 24 min   Charges:   PT Evaluation $Initial PT Evaluation Tier I: 1 Procedure PT Treatments $Gait Training: 8-22 mins $Therapeutic Exercise: 8-22 mins   PT G Codes:          Etai Copado,KATHrine E 08/31/2014, 12:33 PM Zenovia JarredKati Toriann Spadoni, PT, DPT 08/31/2014 Pager: [phone]

## 2014-08-31 NOTE — Care Management Note (Signed)
    Page 1 of 1   08/31/2014     3:34:53 PM CARE MANAGEMENT NOTE 08/31/2014  Patient:  Ryan Meza, Ryan Meza   Account Number:  [id number]  Date Initiated:  08/31/2014  Documentation initiated by:  [facility]  Subjective/Objective Assessment:   KRC:VKFM TOTAL KNEE ARTHROPLASTY (Left)     Action/Plan:   discharge planning   Anticipated DC Date:  08/31/2014   Anticipated DC Plan:  [place]  CM consult      [facility] Choice  HOME HEALTH   Choice offered to / List presented to:  C-1 Patient   DME arranged  NA        [place] arranged  HH-2 PT      [place]   Status of service:  Completed, signed off Medicare Important Message given?   (If response is "NO", the following Medicare IM given date fields will be blank) Date Medicare IM given:   Medicare IM given by:   Date Additional Medicare IM given:   Additional Medicare IM given by:    Discharge Disposition:  [place]  Per UR Regulation:    If discussed at Long Length of Stay Meetings, dates discussed:    Comments:  08/31/14 15:30 Cm met with pt in room to offer chice of home health agency.  Pt chooses [facility] to render HHPT and asks for ERIN the PT.  Address and contact information verified with pt.  No DME needed.  Referral called to [facility].  No other CM needs were communicated.  Mariane Masters, BSN, CM [phone].

## 2014-08-31 NOTE — Progress Notes (Signed)
Physical Therapy Treatment Note    08/31/14 1400  PT Visit Information  Last PT Received On 08/31/14  Assistance Needed +1  History of Present Illness Pt is a 73 year old male s/p L TKA  PT Time Calculation  PT Start Time 1351  PT Stop Time 1403  PT Time Calculation (min) 12 min  Subjective Data  Subjective Pt ambulated in hallway again and then assisted back to bed for CPM.  Precautions  Precautions Knee  Required Braces or Orthoses Knee Immobilizer - Left  Knee Immobilizer - Left Discontinue once straight leg raise with < 10 degree lag  Restrictions  Other Position/Activity Restrictions WBAT  Pain Assessment  Pain Assessment 0-10  Pain Score 3  Pain Location L knee  Pain Descriptors / Indicators Aching;Sore  Pain Intervention(s) Monitored during session;Limited activity within patient's tolerance;Repositioned;Ice applied  Cognition  Arousal/Alertness Awake/alert  Behavior During Therapy WFL for tasks assessed/performed  Overall Cognitive Status Within Functional Limits for tasks assessed  Bed Mobility  Overal bed mobility Needs Assistance  Bed Mobility Supine to Sit;Sit to Supine  Supine to sit Supervision  Sit to supine Min guard  General bed mobility comments verbal cues for technique, difficulty lifting L LE onto bed however pt self assisted with UEs  Transfers  Overall transfer level Needs assistance  Equipment used Rolling walker (2 wheeled)  Transfers Sit to/from Stand  Sit to Stand Min guard  General transfer comment pt able to recall safe technique  Ambulation/Gait  Ambulation/Gait assistance Supervision  Ambulation Distance (Feet) 140 Feet  Assistive device Rolling walker (2 wheeled)  Gait Pattern/deviations Step-through pattern;Decreased step length - right;Decreased stance time - left;Antalgic  General Gait Details verbal cues for technique, posture, step length  PT - End of Session  Equipment Utilized During Treatment Left knee immobilizer  Activity  Tolerance Patient tolerated treatment well  Patient left in bed;with call bell/phone within reach;with family/visitor present  PT - Assessment/Plan  PT Plan Current plan remains appropriate  PT Frequency 7X/week  Follow Up Recommendations Home health PT  PT equipment Rolling walker with 5" wheels  PT Goal Progression  Progress towards PT goals Progressing toward goals  PT General Charges  $$ ACUTE PT VISIT 1 Procedure  PT Treatments  $Gait Training 8-22 mins   Zenovia JarredKati Hazell Siwik, PT, DPT 08/31/2014 Pager: [phone]

## 2014-08-31 NOTE — Discharge Instructions (Addendum)
Information on my medicine - XARELTO (Rivaroxaban)  This medication education was reviewed with me or my healthcare representative as part of my discharge preparation.  The pharmacist that spoke with me during my hospital stay was:  Lolita Patella, [facility]  Why was Xarelto prescribed for you? Xarelto was prescribed for you to reduce the risk of blood clots forming after orthopedic surgery. The medical term for these abnormal blood clots is venous thromboembolism (VTE).  What do you need to know about xarelto ? Take your Xarelto ONCE DAILY at the same time every day. You may take it either with or without food.  If you have difficulty swallowing the tablet whole, you may crush it and mix in applesauce just prior to taking your dose.  Take Xarelto exactly as prescribed by your doctor and DO NOT stop taking Xarelto without talking to the doctor who prescribed the medication.  Stopping without other VTE prevention medication to take the place of Xarelto may increase your risk of developing a clot.  After discharge, you should have regular check-up appointments with your healthcare provider that is prescribing your Xarelto.    What do you do if you miss a dose? If you miss a dose, take it as soon as you remember on the same day then continue your regularly scheduled once daily regimen the next day. Do not take two doses of Xarelto on the same day.   Important Safety Information A possible side effect of Xarelto is bleeding. You should call your healthcare provider right away if you experience any of the following: ? Bleeding from an injury or your nose that does not stop. ? Unusual colored urine (red or dark brown) or unusual colored stools (red or black). ? Unusual bruising for unknown reasons. ? A serious fall or if you hit your head (even if there is no bleeding).  Some medicines may interact with Xarelto and might increase your risk of bleeding while on Xarelto. To help  avoid this, consult your healthcare provider or pharmacist prior to using any new prescription or non-prescription medications, including herbals, vitamins, non-steroidal anti-inflammatory drugs (NSAIDs) and supplements.  This website has more information on Xarelto: [email or web address].[email or web address]     Dr. Gaynelle Arabian Total Joint Specialist [facility] [address], [place],  [address] [phone]  TOTAL KNEE REPLACEMENT POSTOPERATIVE DIRECTIONS    Knee Rehabilitation, Guidelines Following Surgery  Results after knee surgery are often greatly improved when you follow the exercise, range of motion and muscle strengthening exercises prescribed by your doctor. Safety measures are also important to protect the knee from further injury. Any time any of these exercises cause you to have increased pain or swelling in your knee joint, decrease the amount until you are comfortable again and slowly increase them. If you have problems or questions, call your caregiver or physical therapist for advice.   HOME CARE INSTRUCTIONS  Remove items at home which could result in a fall. This includes throw rugs or furniture in walking pathways.  Continue medications as instructed at time of discharge. You may have some home medications which will be placed on hold until you complete the course of blood thinner medication.  You may start showering once you are discharged home but do not submerge the incision under water. Just pat the incision dry and apply a dry gauze dressing on daily. Walk with walker as instructed.  You may resume a sexual relationship in one month or when given the OK by  your doctor.   Use walker as long as suggested by your caregivers.  Avoid periods of inactivity such as sitting longer than an hour when not asleep. This helps prevent blood clots.  You may put full weight on your legs and walk as much as is comfortable.  You may return to work once you are  cleared by your doctor.  Do not drive a car for 6 weeks or until released by you surgeon.   Do not drive while taking narcotics.  Wear the elastic stockings for three weeks following surgery during the day but you may remove then at night. Make sure you keep all of your appointments after your operation with all of your doctors and caregivers. You should call the office at the above phone number and make an appointment for approximately two weeks after the date of your surgery. Change the dressing daily and reapply a dry dressing each time. Please pick up a stool softener and laxative for home use as long as you are requiring pain medications.  Continue to use ice on the knee for pain and swelling from surgery. You may notice swelling that will progress down to the foot and ankle.  This is normal after surgery.  Elevate the leg when you are not up walking on it.   It is important for you to complete the blood thinner medication as prescribed by your doctor.  Continue to use the breathing machine which will help keep your temperature down.  It is common for your temperature to cycle up and down following surgery, especially at night when you are not up moving around and exerting yourself.  The breathing machine keeps your lungs expanded and your temperature down.  RANGE OF MOTION AND STRENGTHENING EXERCISES  Rehabilitation of the knee is important following a knee injury or an operation. After just a few days of immobilization, the muscles of the thigh which control the knee become weakened and shrink (atrophy). Knee exercises are designed to build up the tone and strength of the thigh muscles and to improve knee motion. Often times heat used for twenty to thirty minutes before working out will loosen up your tissues and help with improving the range of motion but do not use heat for the first two weeks following surgery. These exercises can be done on a training (exercise) mat, on the floor, on a  table or on a bed. Use what ever works the best and is most comfortable for you Knee exercises include:  Leg Lifts - While your knee is still immobilized in a splint or cast, you can do straight leg raises. Lift the leg to 60 degrees, hold for 3 sec, and slowly lower the leg. Repeat 10-20 times 2-3 times daily. Perform this exercise against resistance later as your knee gets better.  Quad and Hamstring Sets - Tighten up the muscle on the front of the thigh (Quad) and hold for 5-10 sec. Repeat this 10-20 times hourly. Hamstring sets are done by pushing the foot backward against an object and holding for 5-10 sec. Repeat as with quad sets.  A rehabilitation program following serious knee injuries can speed recovery and prevent re-injury in the future due to weakened muscles. Contact your doctor or a physical therapist for more information on knee rehabilitation.   SKILLED REHAB INSTRUCTIONS: If the patient is transferred to a skilled rehab facility following release from the hospital, a list of the current medications will be sent to the facility for the patient  to continue.  When discharged from the skilled rehab facility, please have the facility set up the patient's Home Health Physical Therapy prior to being released. Also, the skilled facility will be responsible for providing the patient with their medications at time of release from the facility to include their pain medication, the muscle relaxants, and their blood thinner medication. If the patient is still at the rehab facility at time of the two week follow up appointment, the skilled rehab facility will also need to assist the patient in arranging follow up appointment in our office and any transportation needs.  MAKE SURE YOU:  Understand these instructions.  Will watch your condition.  Will get help right away if you are not doing well or get worse.    Pick up stool softner and laxative for home. Do not submerge incision under water. May  shower. Continue to use ice for pain and swelling from surgery.  Take Xarelto for two and a half more weeks, then discontinue Xarelto. Once the patient has completed the Xarelto, they may resume the 81 mg Aspirin.  Postoperative Constipation Protocol  Constipation - defined medically as fewer than three stools per week and severe constipation as less than one stool per week.  One of the most common issues patients have following surgery is constipation.  Even if you have a regular bowel pattern at home, your normal regimen is likely to be disrupted due to multiple reasons following surgery.  Combination of anesthesia, postoperative narcotics, change in appetite and fluid intake all can affect your bowels.  In order to avoid complications following surgery, here are some recommendations in order to help you during your recovery period.  Colace (docusate) - Pick up an over-the-counter form of Colace or another stool softener and take twice a day as long as you are requiring postoperative pain medications.  Take with a full glass of water daily.  If you experience loose stools or diarrhea, hold the colace until you stool forms back up.  If your symptoms do not get better within 1 week or if they get worse, check with your doctor.  Dulcolax (bisacodyl) - Pick up over-the-counter and take as directed by the product packaging as needed to assist with the movement of your bowels.  Take with a full glass of water.  Use this product as needed if not relieved by Colace only.   MiraLax (polyethylene glycol) - Pick up over-the-counter to have on hand.  MiraLax is a solution that will increase the amount of water in your bowels to assist with bowel movements.  Take as directed and can mix with a glass of water, juice, soda, coffee, or tea.  Take if you go more than two days without a movement. Do not use MiraLax more than once per day. Call your doctor if you are still constipated or irregular after using this  medication for 7 days in a row.  If you continue to have problems with postoperative constipation, please contact the office for further assistance and recommendations.  If you experience "the worst abdominal pain ever" or develop nausea or vomiting, please contact the office immediatly for further recommendations for treatment.

## 2014-08-31 NOTE — Progress Notes (Signed)
   Subjective: 1 Day Post-Op Procedure(s) (LRB): Left TOTAL KNEE ARTHROPLASTY (Left) Patient reports pain as mild.   Patient seen in rounds with Dr. Lequita HaltAluisio.  He is only using Tylenol for pain control at this time. Patient is well, and has had no acute complaints or problems We will start therapy today.  Plan is to go Home after hospital stay.  Objective: Vital signs in last 24 hours: Temp:  [97.1 F (36.2 C)-98.7 F (37.1 C)] 97.6 F (36.4 C) (11/03 0619) Pulse Rate:  [50-68] 61 (11/03 0619) Resp:  [13-19] 16 (11/03 0619) BP: (141-178)/(76-91) 151/76 mmHg (11/03 0619) SpO2:  [95 %-100 %] 97 % (11/03 0619) Weight:  [83.008 kg (183 lb)] 83.008 kg (183 lb) (11/02 1059)  Intake/Output from previous day:  Intake/Output Summary (Last 24 hours) at 08/31/14 0929 Last data filed at 08/31/14 [id number]  Gross per 24 hour  Intake 4253.34 ml  Output   2605 ml  Net 1648.34 ml    Intake/Output this shift: UOP 1150 since around MN  Labs:  Recent Labs  08/31/14 0348  HGB 12.3*    Recent Labs  08/31/14 0348  WBC 12.4*  RBC 4.12*  HCT 36.1*  PLT 200    Recent Labs  08/31/14 0348  NA 133*  K 4.6  CL 99  CO2 23  BUN 16  CREATININE 0.88  GLUCOSE 157*  CALCIUM 9.0   No results for input(s): LABPT, INR in the last 72 hours.  EXAM General - Patient is Alert, Appropriate and Oriented Extremity - Neurovascular intact Sensation intact distally Dorsiflexion/Plantar flexion intact Dressing - dressing C/D/I Motor Function - intact, moving foot and toes well on exam.  Hemovac pulled without difficulty.  Past Medical History  Diagnosis Date  . Hyperlipidemia     under control  . GERD (gastroesophageal reflux disease)   . Wears glasses   . Full dentures   . Arthritis   . Double vision     occasional  . Glaucoma     right eye  . Hypertension     under control  . History of blood transfusion 2013    2 unit, no reaction    Assessment/Plan: 1 Day Post-Op  Procedure(s) (LRB): Left TOTAL KNEE ARTHROPLASTY (Left) Principal Problem:   OA (osteoarthritis) of knee  Estimated body mass index is 24.81 kg/(m^2) as calculated from the following:   Height as of this encounter: 6' (1.829 m).   Weight as of this encounter: 83.008 kg (183 lb). Advance diet Up with therapy Plan for discharge tomorrow Discharge home with home health  DVT Prophylaxis - Xarelto Weight-Bearing as tolerated to left leg D/C O2 and Pulse OX and try on Room Air  Ryan Peacerew Perkins, PA-C Orthopaedic Surgery 08/31/2014, 9:29 AM

## 2014-08-31 NOTE — Plan of Care (Signed)
Problem: Phase I Progression Outcomes Goal: Hemodynamically stable Outcome: Completed/Met Date Met:  08/31/14     

## 2014-08-31 NOTE — Plan of Care (Signed)
Problem: Phase I Progression Outcomes Goal: CMS/Neurovascular status WDL Outcome: Completed/Met Date Met:  08/31/14

## 2014-08-31 NOTE — Plan of Care (Signed)
Problem: Phase I Progression Outcomes Goal: Initial discharge plan identified Outcome: Completed/Met Date Met:  08/31/14     

## 2014-09-01 LAB — BASIC METABOLIC PANEL
ANION GAP: 12 (ref 5–15)
BUN: 18 mg/dL (ref 6–23)
CHLORIDE: 103 meq/L (ref 96–112)
CO2: 24 mEq/L (ref 19–32)
Calcium: 9.1 mg/dL (ref 8.4–10.5)
Creatinine, Ser: 1.01 mg/dL (ref 0.50–1.35)
GFR, EST AFRICAN AMERICAN: 82 mL/min — AB (ref >90)
GFR, EST NON AFRICAN AMERICAN: 71 mL/min — AB (ref >90)
Glucose, Bld: 133 mg/dL — ABNORMAL HIGH (ref 70–99)
POTASSIUM: 4 meq/L (ref 3.7–5.3)
SODIUM: 139 meq/L (ref 137–147)

## 2014-09-01 LAB — CBC
HCT: 34.2 % — ABNORMAL LOW (ref 39.0–52.0)
Hemoglobin: 11.4 g/dL — ABNORMAL LOW (ref 13.0–17.0)
MCH: 29.6 pg (ref 26.0–34.0)
MCHC: 33.3 g/dL (ref 30.0–36.0)
MCV: 88.8 fL (ref 78.0–100.0)
PLATELETS: 207 10*3/uL (ref 150–400)
RBC: 3.85 MIL/uL — ABNORMAL LOW (ref 4.22–5.81)
RDW: 12.5 % (ref 11.5–15.5)
WBC: 13.4 10*3/uL — AB (ref 4.0–10.5)

## 2014-09-01 NOTE — Progress Notes (Signed)
   Subjective: 2 Days Post-Op Procedure(s) (LRB): Left TOTAL KNEE ARTHROPLASTY (Left) Patient reports pain as mild.   Patient seen in rounds with Dr. Lequita HaltAluisio. Patient is well, and has had no acute complaints or problems Patient is ready to go home  Objective: Vital signs in last 24 hours: Temp:  [97.7 F (36.5 C)-98.9 F (37.2 C)] 98.3 F (36.8 C) (11/04 0440) Pulse Rate:  [64-70] 65 (11/04 0440) Resp:  [16-18] 16 (11/04 0440) BP: (136-157)/(66-77) 154/76 mmHg (11/04 0440) SpO2:  [93 %-100 %] 97 % (11/04 0440)  Intake/Output from previous day:  Intake/Output Summary (Last 24 hours) at 09/01/14 0806 Last data filed at 09/01/14 0440  Gross per 24 hour  Intake 933.34 ml  Output   2925 ml  Net -1991.66 ml    Labs:  Recent Labs  08/31/14 0348 09/01/14 0354  HGB 12.3* 11.4*    Recent Labs  08/31/14 0348 09/01/14 0354  WBC 12.4* 13.4*  RBC 4.12* 3.85*  HCT 36.1* 34.2*  PLT 200 207    Recent Labs  08/31/14 0348 09/01/14 0354  NA 133* 139  K 4.6 4.0  CL 99 103  CO2 23 24  BUN 16 18  CREATININE 0.88 1.01  GLUCOSE 157* 133*  CALCIUM 9.0 9.1   No results for input(s): LABPT, INR in the last 72 hours.  EXAM: General - Patient is Alert, Appropriate and Oriented Extremity - Neurovascular intact Sensation intact distally Dorsiflexion/Plantar flexion intact Incision - clean, dry, no drainage Motor Function - intact, moving foot and toes well on exam.   Assessment/Plan: 2 Days Post-Op Procedure(s) (LRB): Left TOTAL KNEE ARTHROPLASTY (Left) Procedure(s) (LRB): Left TOTAL KNEE ARTHROPLASTY (Left) Past Medical History  Diagnosis Date  . Hyperlipidemia     under control  . GERD (gastroesophageal reflux disease)   . Wears glasses   . Full dentures   . Arthritis   . Double vision     occasional  . Glaucoma     right eye  . Hypertension     under control  . History of blood transfusion 2013    2 unit, no reaction   Principal Problem:   OA  (osteoarthritis) of knee  Estimated body mass index is 24.81 kg/(m^2) as calculated from the following:   Height as of this encounter: 6' (1.829 m).   Weight as of this encounter: 83.008 kg (183 lb). Up with therapy Discharge home with home health Diet - Cardiac diet Follow up - in 2 weeks Activity - WBAT Disposition - Home Condition Upon Discharge - Good D/C Meds - See DC Summary DVT Prophylaxis - Xarelto for a total of three weeks.  Avel Peacerew Mariusz Jubb, PA-C Orthopaedic Surgery 09/01/2014, 8:06 AM

## 2014-09-01 NOTE — Progress Notes (Signed)
Physical Therapy Treatment Patient Details Name: Ryan Meza MRN: [id number] DOB: Apr 05, 1941 Today's Date: 09/01/2014    History of Present Illness Pt is a 73 year old male s/p L TKA    PT Comments    Pt ambulated in hallway, practiced steps with spouse, and performed LE exercises.  Pt had no further questions and feels ready for d/c home today.   Follow Up Recommendations  Home health PT     Equipment Recommendations  Rolling walker with 5" wheels    Recommendations for Other Services       Precautions / Restrictions Precautions Precautions: Knee Required Braces or Orthoses: Knee Immobilizer - Left Knee Immobilizer - Left: Discontinue once straight leg raise with < 10 degree lag Restrictions Weight Bearing Restrictions: No Other Position/Activity Restrictions: WBAT    Mobility  Bed Mobility Overal bed mobility: Needs Assistance Bed Mobility: Supine to Sit     Supine to sit: Supervision        Transfers Overall transfer level: Needs assistance Equipment used: Rolling walker (2 wheeled) Transfers: Sit to/from Stand Sit to Stand: Supervision            Ambulation/Gait Ambulation/Gait assistance: Supervision Ambulation Distance (Feet): 240 Feet Assistive device: Rolling walker (2 wheeled) Gait Pattern/deviations: Step-through pattern;Decreased stance time - left;Decreased step length - right;Antalgic     General Gait Details: verbal cues for posture, step length   Stairs Stairs: Yes Stairs assistance: Min guard Stair Management: Step to pattern;Backwards;With walker Number of Stairs: 2 General stair comments: spouse and pt educated on safe stair technique, sequence, safety, RW placement, spouse assisted holding RW in place  Wheelchair Mobility    Modified Rankin (Stroke Patients Only)       Balance                                    Cognition Arousal/Alertness: Awake/alert Behavior During Therapy: WFL for tasks  assessed/performed Overall Cognitive Status: Within Functional Limits for tasks assessed                      Exercises Total Joint Exercises Ankle Circles/Pumps: AROM;Both;15 reps Quad Sets: AROM;Both;15 reps Short Arc QuadBarbaraann Meza: AAROM;Left;15 reps Heel Slides: AAROM;Left;15 reps Hip ABduction/ADduction: AROM;Left;15 reps Straight Leg Raises: AAROM;Left;10 reps    General Comments        Pertinent Vitals/Pain Pain Assessment: 0-10 Pain Score: 2  Pain Location: L thigh Pain Descriptors / Indicators: Sore Pain Intervention(s): Limited activity within patient's tolerance;Monitored during session;Premedicated before session;Repositioned;Ice applied    Home Living                      Prior Function            PT Goals (current goals can now be found in the care plan section) Progress towards PT goals: Progressing toward goals    Frequency  7X/week    PT Plan Current plan remains appropriate    Co-evaluation             End of Session Equipment Utilized During Treatment: Left knee immobilizer Activity Tolerance: Patient tolerated treatment well Patient left: with call bell/phone within reach;with family/visitor present;in chair     Time: [id number] PT Time Calculation (min): 27 min  Charges:  $Gait Training: 8-22 mins $Therapeutic Exercise: 8-22 mins  G Codes:      Ryan Meza,Ryan Meza 09/01/2014, 11:02 AM Ryan Meza, PT, DPT 09/01/2014 Pager: [phone]

## 2015-01-13 ENCOUNTER — Ambulatory Visit: Payer: Self-pay | Admitting: Orthopedic Surgery

## 2015-01-13 NOTE — H&P (Signed)
Valora Piccolo DOB: 1941/04/21 Married / Language: English / Race: White Male Date of Admission:  01/31/2015 CC:  Right knee pain History of Present Illness The patient is a 74 year old male who comes in for a preoperative History and Physical. The patient is scheduled for a right total knee arthroplasty to be performed by Dr. Gus Rankin. Aluisio, MD at [facility] on 01-31-2015. The patient is a 74 year old male presenting for a post-operative visit. The patient comes in a couple months out from left total knee arthroplasty. The patient states that he is doing very well at this time. The pain is under excellent control at this time and describe their pain as mild. They are currently on Tylenol for their pain. The patient is currently doing outpatient physical therapy. The patient feels that they are progressing well at this time. He is real pleased with how his knee is doing. Pain is greatly improved from preoperative. Now, the opposite right knee continues to hurt but not quite as bad as the left one prior to its surgery. However, the right knee does hurt and has gotten progressively worse lately. He is able to walk and do his rehab for the left total knee but he is ready to go ahead and proceed with the right knee at this time. They have been treated conservatively in the past for the above stated problem and despite conservative measures, they continue to have progressive pain and severe functional limitations and dysfunction. They have failed non-operative management including home exercise, medications, and injections. It is felt that they would benefit from undergoing total joint replacement. Risks and benefits of the procedure have been discussed with the patient and they elect to proceed with surgery. There are no active contraindications to surgery such as ongoing infection or rapidly progressive neurological disease.   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  Social History  Never consumed alcohol 04/27/2014: Never consumed alcohol Number of flights of stairs before winded 2-3 Marital status married No history of drug/alcohol rehab Not under pain contract Tobacco / smoke exposure 04/27/2014: no Exercise Exercises rarely; does other Children 2 Current work status retired Tobacco use Former smoker. 04/27/2014: smoke(d) 1 1/2 pack(s) per day Living situation live with spouse  Medication History  Combigan (0.2-0.5% Solution, Ophthalmic) Active. Lumigan (0.01% Solution, Ophthalmic) Active. Atorvastatin Calcium (  Tablet, Oral) Active. Lisinopril (  Tablet, Oral) Active. Omeprazole (  Tablet DR, Oral) Active. Vitamin D3 (1000UNIT Tablet, Oral) Active. Multivitamin (Oral) Active. Aspirin EC Low Strength (  Tablet DR, Oral) Active.  Past Surgical History Cataract Surgery right Prostatectomy; Transurethral   Review of Systems General Not Present- Chills, Fatigue, Fever, Memory Loss, Night Sweats, Weight Gain and Weight Loss. Skin Not Present- Eczema, Hives, Itching, Lesions and Rash. HEENT Not Present- Dentures, Double Vision, Headache, Hearing Loss, Tinnitus and Visual Loss. Respiratory Not Present- Allergies, Chronic Cough, Coughing up blood, Shortness of breath at rest and Shortness of breath with exertion. Cardiovascular Not Present- Chest Pain, Difficulty Breathing Lying Down, Murmur, Palpitations, Racing/skipping heartbeats and Swelling. Gastrointestinal Not Present- Abdominal Pain, Bloody Stool, Constipation, Diarrhea, Difficulty Swallowing, Heartburn, Jaundice, Loss of appetitie, Nausea and Vomiting. Male Genitourinary Not Present- Blood in Urine, Discharge, Flank Pain,  Incontinence, Painful Urination, Urgency, Urinary frequency, Urinary Retention, Urinating at Night and Weak urinary stream. Musculoskeletal Present-  Joint Pain. Not Present- Back Pain, Joint Swelling, Morning Stiffness, Muscle Pain, Muscle Weakness and Spasms. Neurological Not Present- Blackout spells, Difficulty with balance, Dizziness, Paralysis, Tremor and Weakness. Psychiatric Not Present- Insomnia.  Vitals  Weight: 171 lb Height: 71.5in Weight was reported by patient. Height was reported by patient. Body Surface Area: 1.98 m Body Mass Index: 23.52 kg/m  BP: 128/70 (Sitting, Right Arm, Standard)  Physical Exam  General Mental Status -Alert, cooperative and good historian. General Appearance-pleasant, Not in acute distress. Orientation-Oriented X3. Build & Nutrition-Well nourished and Well developed.  Head and Neck Head-normocephalic, atraumatic . Neck Global Assessment - supple, no bruit auscultated on the right, no bruit auscultated on the left. Note: upper and lower dentures   Eye Vision-Wears corrective lenses. Pupil - Bilateral-Regular and Round. Motion - Bilateral-EOMI.  Chest and Lung Exam Auscultation Breath sounds - clear at anterior chest wall and clear at posterior chest wall. Adventitious sounds - No Adventitious sounds.  Cardiovascular Auscultation Rhythm - Regular rate and rhythm. Heart Sounds - S1 WNL and S2 WNL. Murmurs & Other Heart Sounds - Auscultation of the heart reveals - No Murmurs.  Abdomen Palpation/Percussion Tenderness - Abdomen is non-tender to palpation. Rigidity (guarding) - Abdomen is soft. Auscultation Auscultation of the abdomen reveals - Bowel sounds normal.  Male Genitourinary Note: Not done, not pertinent to present illness   Musculoskeletal Note: His right knee has severe varus. Range is 5-130. Marked crepitus on range of motion. Tender medial greater than lateral with no instability. Left  Knee: Wound/Incision - Appearance - The incision is well healed with no erythema or exudates. ROM: Flexion - AROM - 120 . PROM - 130 . Extension - AROM - 5 . PROM - 0 . Gait and Station: Evaluation of Gait - Gait Pattern - normal gait pattern. Gait and Station - [employer] Devices - no assistive devices.   Assessment & Plan (Alexzandrew L. Perkins III PA-C; 01/13/2015 11:08 AM) Primary localized osteoarthritis of right knee (M17.11) Status post total left knee replacement (Z61.096(Z96.652) Note:Surgical Plans: Right Total Knee Repalcement  Disposition: Home  PCP: Dr. Rodrigo RanMark Perini  IV TXA  Anesthesia Issues: None  Signed electronically by Lauraine RinneAlexzandrew L Perkins, III PA-C

## 2015-01-19 ENCOUNTER — Encounter (HOSPITAL_COMMUNITY)
Admission: RE | Admit: 2015-01-19 | Discharge: 2015-01-19 | Disposition: A | Discharge status: Home / Self Care | Payer: Medicare Other | Source: Ambulatory Visit | Attending: Orthopedic Surgery | Admitting: Orthopedic Surgery

## 2015-01-19 ENCOUNTER — Encounter (HOSPITAL_COMMUNITY): Payer: Self-pay

## 2015-01-19 DIAGNOSIS — Z01812 Encounter for preprocedural laboratory examination: Secondary | ICD-10-CM | POA: Diagnosis present

## 2015-01-19 LAB — URINALYSIS, ROUTINE W REFLEX MICROSCOPIC
BILIRUBIN URINE: NEGATIVE
Glucose, UA: NEGATIVE mg/dL
Hgb urine dipstick: NEGATIVE
KETONES UR: NEGATIVE mg/dL
LEUKOCYTES UA: NEGATIVE
NITRITE: NEGATIVE
PH: 7 (ref 5.0–8.0)
PROTEIN: NEGATIVE mg/dL
Specific Gravity, Urine: 1.011 (ref 1.005–1.030)
UROBILINOGEN UA: 0.2 mg/dL (ref 0.0–1.0)

## 2015-01-19 LAB — PROTIME-INR
INR: 0.93 (ref 0.00–1.49)
PROTHROMBIN TIME: 12.5 s (ref 11.6–15.2)

## 2015-01-19 LAB — COMPREHENSIVE METABOLIC PANEL
ALT: 17 U/L (ref 0–53)
AST: 22 U/L (ref 0–37)
Albumin: 4 g/dL (ref 3.5–5.2)
Alkaline Phosphatase: 103 U/L (ref 39–117)
Anion gap: 7 (ref 5–15)
BILIRUBIN TOTAL: 0.7 mg/dL (ref 0.3–1.2)
BUN: 17 mg/dL (ref 6–23)
CHLORIDE: 106 mmol/L (ref 96–112)
CO2: 26 mmol/L (ref 19–32)
Calcium: 9.7 mg/dL (ref 8.4–10.5)
Creatinine, Ser: 1.04 mg/dL (ref 0.50–1.35)
GFR calc Af Amer: 79 mL/min — ABNORMAL LOW (ref >90)
GFR, EST NON AFRICAN AMERICAN: 68 mL/min — AB (ref >90)
Glucose, Bld: 106 mg/dL — ABNORMAL HIGH (ref 70–99)
POTASSIUM: 4.2 mmol/L (ref 3.5–5.1)
Sodium: 139 mmol/L (ref 135–145)
Total Protein: 7.5 g/dL (ref 6.0–8.3)

## 2015-01-19 LAB — CBC
HCT: 42.4 % (ref 39.0–52.0)
HEMOGLOBIN: 13.4 g/dL (ref 13.0–17.0)
MCH: 27.3 pg (ref 26.0–34.0)
MCHC: 31.6 g/dL (ref 30.0–36.0)
MCV: 86.4 fL (ref 78.0–100.0)
Platelets: 251 10*3/uL (ref 150–400)
RBC: 4.91 MIL/uL (ref 4.22–5.81)
RDW: 13.7 % (ref 11.5–15.5)
WBC: 6.8 10*3/uL (ref 4.0–10.5)

## 2015-01-19 LAB — SURGICAL PCR SCREEN
MRSA, PCR: NEGATIVE
STAPHYLOCOCCUS AUREUS: NEGATIVE

## 2015-01-19 LAB — APTT: aPTT: 33 seconds (ref 24–37)

## 2015-01-19 NOTE — Patient Instructions (Addendum)
18 Ryan Meza  01/19/2015   Your procedure is scheduled on:     Monday January 31, 2015   [facility] and follow signs to  [facility] arrive at 5:00 AM.   Call this number if you have problems the morning of surgery [phone] or Presurgical Testing [phone].   Remember:  Do not eat food or drink liquids :After Midnight.     Take these medicines the morning of surgery with A SIP OF WATER: Omeprazole (Prilosec); Combigan (Brimonidine-Timodol) eye gtts - bring day of surgery                               You may not have any metal on your body including hair pins and piercings  Do not wear jewelry, colognes, lotions, powders, or deodorant.  Men may shave face and neck.               Do not bring valuables to the hospital. [facility] IS NOT RESPONSIBLE FOR VALUABLES.  Contacts, dentures or bridgework may not be worn into surgery.  Leave suitcase in the car. After surgery it may be brought to your room.  For patients admitted to the hospital, checkout time is 11:00 AM the day of discharge.     Special Instructions: review fact sheets for MRSA information, Blood Transfusion fact sheet, Incentive Spirometry. ________________________________________________________________________  [facility] - Preparing [facility] Before surgery, you can play an important role.  Because skin is not sterile, your skin needs to be as free of germs as possible.  You can reduce the number of germs on your skin by washing with CHG (chlorahexidine gluconate) soap before surgery.  CHG is an antiseptic cleaner which kills germs and bonds with the skin to continue killing germs even after washing. Please DO NOT use if you have an allergy to CHG or antibacterial soaps.  If your skin becomes reddened/irritated stop using the CHG and inform your nurse when you arrive at Short Stay. Do not shave (including legs and underarms) for at least 48 hours prior to the first CHG  shower.  You may shave your face/neck. Please follow these instructions carefully:  1.  Shower with CHG Soap the night before surgery and the  morning of Surgery.  2.  If you choose to wash your hair, wash your hair first as usual with your  normal  shampoo.  3.  After you shampoo, rinse your hair and body thoroughly to remove the  shampoo.                           4.  Use CHG as you would any other liquid soap.  You can apply chg directly  to the skin and wash                       Gently with a scrungie or clean washcloth.  5.  Apply the CHG Soap to your body ONLY FROM THE NECK DOWN.   Do not use on face/ open                           Wound or open sores. Avoid contact with eyes, ears mouth and genitals (private parts).  Wash face,  Genitals (private parts) with your normal soap.             6.  Wash thoroughly, paying special attention to the area where your surgery  will be performed.  7.  Thoroughly rinse your body with warm water from the neck down.  8.  DO NOT shower/wash with your normal soap after using and rinsing off  the CHG Soap.                9.  Pat yourself dry with a clean towel.            10.  Wear clean pajamas.            11.  Place clean sheets on your bed the night of your first shower and do not  sleep with pets. Day of Surgery : Do not apply any lotions/deodorants the morning of surgery.  Please wear clean clothes to the hospital/surgery center.  FAILURE TO FOLLOW THESE INSTRUCTIONS MAY RESULT IN THE CANCELLATION OF YOUR SURGERY PATIENT SIGNATURE_________________________________  NURSE SIGNATURE__________________________________  ________________________________________________________________________

## 2015-01-19 NOTE — Progress Notes (Signed)
CXR per epic 08/30/2014 EKG per epic 06/01/2014

## 2015-01-31 ENCOUNTER — Encounter (HOSPITAL_COMMUNITY): Payer: Self-pay | Admitting: *Deleted

## 2015-01-31 ENCOUNTER — Encounter (HOSPITAL_COMMUNITY)
Admission: RE | Disposition: A | Discharge status: Home Health | Payer: Self-pay | Source: Ambulatory Visit | Attending: Orthopedic Surgery

## 2015-01-31 ENCOUNTER — Inpatient Hospital Stay (HOSPITAL_COMMUNITY)
Admission: RE | Admit: 2015-01-31 | Discharge: 2015-02-01 | DRG: 470 | Disposition: A | Discharge status: Home Health | Payer: Medicare Other | Source: Ambulatory Visit | Attending: Orthopedic Surgery | Admitting: Orthopedic Surgery

## 2015-01-31 ENCOUNTER — Inpatient Hospital Stay (HOSPITAL_COMMUNITY): Payer: Medicare Other | Admitting: Certified Registered Nurse Anesthetist

## 2015-01-31 DIAGNOSIS — Z01812 Encounter for preprocedural laboratory examination: Secondary | ICD-10-CM | POA: Diagnosis not present

## 2015-01-31 DIAGNOSIS — M1711 Unilateral primary osteoarthritis, right knee: Secondary | ICD-10-CM

## 2015-01-31 DIAGNOSIS — E780 Pure hypercholesterolemia: Secondary | ICD-10-CM | POA: Diagnosis present

## 2015-01-31 DIAGNOSIS — Z96652 Presence of left artificial knee joint: Secondary | ICD-10-CM | POA: Diagnosis present

## 2015-01-31 DIAGNOSIS — I10 Essential (primary) hypertension: Secondary | ICD-10-CM | POA: Diagnosis present

## 2015-01-31 DIAGNOSIS — Z87891 Personal history of nicotine dependence: Secondary | ICD-10-CM | POA: Diagnosis not present

## 2015-01-31 DIAGNOSIS — Z79899 Other long term (current) drug therapy: Secondary | ICD-10-CM

## 2015-01-31 DIAGNOSIS — Z7982 Long term (current) use of aspirin: Secondary | ICD-10-CM

## 2015-01-31 DIAGNOSIS — M1710 Unilateral primary osteoarthritis, unspecified knee: Secondary | ICD-10-CM | POA: Diagnosis present

## 2015-01-31 DIAGNOSIS — E785 Hyperlipidemia, unspecified: Secondary | ICD-10-CM | POA: Diagnosis present

## 2015-01-31 DIAGNOSIS — H409 Unspecified glaucoma: Secondary | ICD-10-CM | POA: Diagnosis present

## 2015-01-31 DIAGNOSIS — K219 Gastro-esophageal reflux disease without esophagitis: Secondary | ICD-10-CM | POA: Diagnosis present

## 2015-01-31 DIAGNOSIS — M179 Osteoarthritis of knee, unspecified: Secondary | ICD-10-CM | POA: Diagnosis present

## 2015-01-31 DIAGNOSIS — M25561 Pain in right knee: Secondary | ICD-10-CM | POA: Diagnosis present

## 2015-01-31 HISTORY — PX: TOTAL KNEE ARTHROPLASTY: SHX125

## 2015-01-31 SURGERY — ARTHROPLASTY, KNEE, TOTAL
Anesthesia: Spinal | Site: Knee | Laterality: Right

## 2015-01-31 MED ORDER — SODIUM CHLORIDE 0.9 % IJ SOLN
INTRAMUSCULAR | Status: DC | PRN
  Administered 2015-01-31: 30 mL

## 2015-01-31 MED ORDER — BUPIVACAINE LIPOSOME 1.3 % IJ SUSP
20.0000 mL | Freq: Once | INTRAMUSCULAR | Status: DC
  Filled 2015-01-31: qty 20

## 2015-01-31 MED ORDER — METOCLOPRAMIDE HCL 5 MG/ML IJ SOLN: 5.0000 mg | Freq: Three times a day (TID) | INTRAMUSCULAR | Status: DC | PRN

## 2015-01-31 MED ORDER — RIVAROXABAN 10 MG PO TABS
10.0000 mg | ORAL_TABLET | Freq: Every day | ORAL | Status: DC
  Administered 2015-02-01: 10 mg via ORAL
  Filled 2015-01-31 (×2): qty 1

## 2015-01-31 MED ORDER — LACTATED RINGERS IV SOLN
INTRAVENOUS | Status: DC | PRN
  Administered 2015-01-31 (×2): via INTRAVENOUS

## 2015-01-31 MED ORDER — DEXAMETHASONE SODIUM PHOSPHATE 10 MG/ML IJ SOLN
10.0000 mg | Freq: Once | INTRAMUSCULAR | Status: AC
  Administered 2015-02-01: 10 mg via INTRAVENOUS
  Filled 2015-01-31: qty 1

## 2015-01-31 MED ORDER — ACETAMINOPHEN 10 MG/ML IV SOLN
1000.0000 mg | Freq: Once | INTRAVENOUS | Status: AC
  Administered 2015-01-31: 1000 mg via INTRAVENOUS
  Filled 2015-01-31: qty 100

## 2015-01-31 MED ORDER — KETOROLAC TROMETHAMINE 15 MG/ML IJ SOLN
7.5000 mg | Freq: Four times a day (QID) | INTRAMUSCULAR | Status: DC | PRN
  Administered 2015-02-01: 7.5 mg via INTRAVENOUS
  Filled 2015-01-31: qty 1

## 2015-01-31 MED ORDER — EPHEDRINE SULFATE 50 MG/ML IJ SOLN
INTRAMUSCULAR | Status: DC | PRN
  Administered 2015-01-31 (×2): 10 mg via INTRAVENOUS

## 2015-01-31 MED ORDER — BUPIVACAINE IN DEXTROSE 0.75-8.25 % IT SOLN
INTRATHECAL | Status: DC | PRN
  Administered 2015-01-31: 2 mL via INTRATHECAL

## 2015-01-31 MED ORDER — SODIUM CHLORIDE 0.9 % IV SOLN: INTRAVENOUS | Status: DC

## 2015-01-31 MED ORDER — BISACODYL 10 MG RE SUPP: 10.0000 mg | Freq: Every day | RECTAL | Status: DC | PRN

## 2015-01-31 MED ORDER — ONDANSETRON HCL 4 MG/2ML IJ SOLN
INTRAMUSCULAR | Status: DC | PRN
  Administered 2015-01-31: 4 mg via INTRAVENOUS

## 2015-01-31 MED ORDER — CEFAZOLIN SODIUM-DEXTROSE 2-3 GM-% IV SOLR
2.0000 g | Freq: Four times a day (QID) | INTRAVENOUS | Status: AC
  Administered 2015-01-31 (×2): 2 g via INTRAVENOUS
  Filled 2015-01-31 (×2): qty 50

## 2015-01-31 MED ORDER — CHLORHEXIDINE GLUCONATE 4 % EX LIQD: 60.0000 mL | Freq: Once | CUTANEOUS | Status: DC

## 2015-01-31 MED ORDER — ONDANSETRON HCL 4 MG/2ML IJ SOLN: 4.0000 mg | Freq: Once | INTRAMUSCULAR | Status: DC | PRN

## 2015-01-31 MED ORDER — PROPOFOL 10 MG/ML IV BOLUS
INTRAVENOUS | Status: AC
  Filled 2015-01-31: qty 20

## 2015-01-31 MED ORDER — CEFAZOLIN SODIUM-DEXTROSE 2-3 GM-% IV SOLR
2.0000 g | INTRAVENOUS | Status: AC
  Administered 2015-01-31: 2 g via INTRAVENOUS

## 2015-01-31 MED ORDER — SODIUM CHLORIDE 0.9 % IJ SOLN
INTRAMUSCULAR | Status: AC
  Filled 2015-01-31: qty 50

## 2015-01-31 MED ORDER — CEFAZOLIN SODIUM-DEXTROSE 2-3 GM-% IV SOLR
INTRAVENOUS | Status: AC
  Filled 2015-01-31: qty 50

## 2015-01-31 MED ORDER — EPHEDRINE SULFATE 50 MG/ML IJ SOLN
INTRAMUSCULAR | Status: AC
  Filled 2015-01-31: qty 1

## 2015-01-31 MED ORDER — OXYCODONE HCL 5 MG PO TABS
5.0000 mg | ORAL_TABLET | ORAL | Status: DC | PRN
  Administered 2015-01-31: 5 mg via ORAL
  Filled 2015-01-31: qty 1

## 2015-01-31 MED ORDER — METHOCARBAMOL 500 MG PO TABS
500.0000 mg | ORAL_TABLET | Freq: Four times a day (QID) | ORAL | Status: DC | PRN
  Administered 2015-01-31 – 2015-02-01 (×3): 500 mg via ORAL
  Filled 2015-01-31 (×3): qty 1

## 2015-01-31 MED ORDER — ONDANSETRON HCL 4 MG PO TABS: 4.0000 mg | ORAL_TABLET | Freq: Four times a day (QID) | ORAL | Status: DC | PRN

## 2015-01-31 MED ORDER — MORPHINE SULFATE 2 MG/ML IJ SOLN: 1.0000 mg | INTRAMUSCULAR | Status: DC | PRN

## 2015-01-31 MED ORDER — SODIUM CHLORIDE 0.9 % IJ SOLN
INTRAMUSCULAR | Status: AC
  Filled 2015-01-31: qty 10

## 2015-01-31 MED ORDER — METOCLOPRAMIDE HCL 10 MG PO TABS
5.0000 mg | ORAL_TABLET | Freq: Three times a day (TID) | ORAL | Status: DC | PRN
Start: 2015-01-31 — End: 2015-02-01

## 2015-01-31 MED ORDER — DOCUSATE SODIUM 100 MG PO CAPS
100.0000 mg | ORAL_CAPSULE | Freq: Two times a day (BID) | ORAL | Status: DC
  Administered 2015-01-31 – 2015-02-01 (×2): 100 mg via ORAL

## 2015-01-31 MED ORDER — MIDAZOLAM HCL 2 MG/2ML IJ SOLN
INTRAMUSCULAR | Status: AC
  Filled 2015-01-31: qty 2

## 2015-01-31 MED ORDER — BRIMONIDINE TARTRATE 0.2 % OP SOLN
1.0000 [drp] | Freq: Two times a day (BID) | OPHTHALMIC | Status: DC
  Administered 2015-01-31 – 2015-02-01 (×2): 1 [drp] via OPHTHALMIC
  Filled 2015-01-31: qty 5

## 2015-01-31 MED ORDER — BUPIVACAINE HCL 0.25 % IJ SOLN
INTRAMUSCULAR | Status: DC | PRN
Start: 2015-01-31 — End: 2015-01-31
  Administered 2015-01-31: 20 mL

## 2015-01-31 MED ORDER — ONDANSETRON HCL 4 MG/2ML IJ SOLN: 4.0000 mg | Freq: Four times a day (QID) | INTRAMUSCULAR | Status: DC | PRN

## 2015-01-31 MED ORDER — LIDOCAINE HCL (CARDIAC) 20 MG/ML IV SOLN
INTRAVENOUS | Status: DC | PRN
  Administered 2015-01-31: 100 mg via INTRAVENOUS

## 2015-01-31 MED ORDER — HYDROMORPHONE HCL 1 MG/ML IJ SOLN
INTRAMUSCULAR | Status: AC
  Filled 2015-01-31: qty 1

## 2015-01-31 MED ORDER — ACETAMINOPHEN 650 MG RE SUPP: 650.0000 mg | Freq: Four times a day (QID) | RECTAL | Status: DC | PRN

## 2015-01-31 MED ORDER — POLYETHYLENE GLYCOL 3350 17 G PO PACK: 17.0000 g | PACK | Freq: Every day | ORAL | Status: DC | PRN

## 2015-01-31 MED ORDER — ATORVASTATIN CALCIUM 40 MG PO TABS
80.0000 mg | ORAL_TABLET | ORAL | Status: DC
Start: 2015-01-31 — End: 2015-02-01
  Administered 2015-01-31: 80 mg via ORAL
  Filled 2015-01-31: qty 2

## 2015-01-31 MED ORDER — FENTANYL CITRATE 0.05 MG/ML IJ SOLN
INTRAMUSCULAR | Status: AC
  Filled 2015-01-31: qty 2

## 2015-01-31 MED ORDER — BUPIVACAINE HCL (PF) 0.25 % IJ SOLN
INTRAMUSCULAR | Status: AC
  Filled 2015-01-31: qty 30

## 2015-01-31 MED ORDER — PROPOFOL 10 MG/ML IV BOLUS
INTRAVENOUS | Status: DC | PRN
  Administered 2015-01-31: 200 mg via INTRAVENOUS

## 2015-01-31 MED ORDER — LIDOCAINE HCL (CARDIAC) 20 MG/ML IV SOLN
INTRAVENOUS | Status: AC
  Filled 2015-01-31: qty 5

## 2015-01-31 MED ORDER — ACETAMINOPHEN 325 MG PO TABS: 650.0000 mg | ORAL_TABLET | Freq: Four times a day (QID) | ORAL | Status: DC | PRN

## 2015-01-31 MED ORDER — BRIMONIDINE TARTRATE-TIMOLOL 0.2-0.5 % OP SOLN: 1.0000 [drp] | Freq: Two times a day (BID) | OPHTHALMIC | Status: DC

## 2015-01-31 MED ORDER — ACETAMINOPHEN 500 MG PO TABS
1000.0000 mg | ORAL_TABLET | Freq: Four times a day (QID) | ORAL | Status: AC
  Administered 2015-01-31 – 2015-02-01 (×4): 1000 mg via ORAL
  Filled 2015-01-31 (×5): qty 2

## 2015-01-31 MED ORDER — ONDANSETRON HCL 4 MG/2ML IJ SOLN
INTRAMUSCULAR | Status: AC
  Filled 2015-01-31: qty 2

## 2015-01-31 MED ORDER — METHOCARBAMOL 1000 MG/10ML IJ SOLN
500.0000 mg | Freq: Four times a day (QID) | INTRAVENOUS | Status: DC | PRN
  Administered 2015-01-31: 500 mg via INTRAVENOUS
  Filled 2015-01-31 (×2): qty 5

## 2015-01-31 MED ORDER — PROPOFOL INFUSION 10 MG/ML OPTIME
INTRAVENOUS | Status: DC | PRN
  Administered 2015-01-31: 25 ug/kg/min via INTRAVENOUS

## 2015-01-31 MED ORDER — DEXAMETHASONE SODIUM PHOSPHATE 10 MG/ML IJ SOLN
10.0000 mg | Freq: Once | INTRAMUSCULAR | Status: AC
  Administered 2015-01-31: 10 mg via INTRAVENOUS

## 2015-01-31 MED ORDER — PANTOPRAZOLE SODIUM 40 MG PO TBEC
40.0000 mg | DELAYED_RELEASE_TABLET | Freq: Every day | ORAL | Status: DC
  Filled 2015-01-31: qty 1

## 2015-01-31 MED ORDER — ATORVASTATIN CALCIUM 40 MG PO TABS
40.0000 mg | ORAL_TABLET | ORAL | Status: DC
  Filled 2015-01-31: qty 1

## 2015-01-31 MED ORDER — BUPIVACAINE LIPOSOME 1.3 % IJ SUSP
INTRAMUSCULAR | Status: DC | PRN
  Administered 2015-01-31: 20 mL

## 2015-01-31 MED ORDER — FLEET ENEMA 7-19 GM/118ML RE ENEM: 1.0000 | ENEMA | Freq: Once | RECTAL | Status: AC | PRN

## 2015-01-31 MED ORDER — GLYCOPYRROLATE 0.2 MG/ML IJ SOLN
INTRAMUSCULAR | Status: DC | PRN
  Administered 2015-01-31: 0.2 mg via INTRAVENOUS

## 2015-01-31 MED ORDER — TRANEXAMIC ACID 100 MG/ML IV SOLN
1000.0000 mg | INTRAVENOUS | Status: AC
  Administered 2015-01-31: 1000 mg via INTRAVENOUS
  Filled 2015-01-31: qty 10

## 2015-01-31 MED ORDER — TIMOLOL MALEATE 0.5 % OP SOLN
1.0000 [drp] | Freq: Two times a day (BID) | OPHTHALMIC | Status: DC
  Administered 2015-01-31 – 2015-02-01 (×2): 1 [drp] via OPHTHALMIC
  Filled 2015-01-31: qty 5

## 2015-01-31 MED ORDER — MIDAZOLAM HCL 5 MG/5ML IJ SOLN
INTRAMUSCULAR | Status: DC | PRN
  Administered 2015-01-31 (×2): 1 mg via INTRAVENOUS

## 2015-01-31 MED ORDER — MENTHOL 3 MG MT LOZG: 1.0000 | LOZENGE | OROMUCOSAL | Status: DC | PRN

## 2015-01-31 MED ORDER — TRAMADOL HCL 50 MG PO TABS: 50.0000 mg | ORAL_TABLET | Freq: Four times a day (QID) | ORAL | Status: DC | PRN

## 2015-01-31 MED ORDER — SODIUM CHLORIDE 0.9 % IR SOLN
Status: DC | PRN
  Administered 2015-01-31: 1000 mL

## 2015-01-31 MED ORDER — LATANOPROST 0.005 % OP SOLN
1.0000 [drp] | Freq: Every day | OPHTHALMIC | Status: DC
  Administered 2015-01-31: 1 [drp] via OPHTHALMIC
  Filled 2015-01-31: qty 2.5

## 2015-01-31 MED ORDER — PHENOL 1.4 % MT LIQD: 1.0000 | OROMUCOSAL | Status: DC | PRN

## 2015-01-31 MED ORDER — DIPHENHYDRAMINE HCL 12.5 MG/5ML PO ELIX: 12.5000 mg | ORAL_SOLUTION | ORAL | Status: DC | PRN

## 2015-01-31 MED ORDER — HYDROMORPHONE HCL 1 MG/ML IJ SOLN
0.2500 mg | INTRAMUSCULAR | Status: DC | PRN
  Administered 2015-01-31 (×4): 0.5 mg via INTRAVENOUS

## 2015-01-31 MED ORDER — DEXAMETHASONE SODIUM PHOSPHATE 4 MG/ML IJ SOLN
INTRAMUSCULAR | Status: DC | PRN
  Administered 2015-01-31: 10 mg via INTRAVENOUS

## 2015-01-31 MED ORDER — FENTANYL CITRATE 0.05 MG/ML IJ SOLN
INTRAMUSCULAR | Status: DC | PRN
  Administered 2015-01-31 (×3): 50 ug via INTRAVENOUS
  Administered 2015-01-31: 100 ug via INTRAVENOUS
  Administered 2015-01-31: 50 ug via INTRAVENOUS

## 2015-01-31 MED ORDER — FENTANYL CITRATE 0.05 MG/ML IJ SOLN
25.0000 ug | INTRAMUSCULAR | Status: DC | PRN
  Administered 2015-01-31 (×3): 50 ug via INTRAVENOUS

## 2015-01-31 MED ORDER — FENTANYL CITRATE 0.05 MG/ML IJ SOLN
INTRAMUSCULAR | Status: AC
  Filled 2015-01-31: qty 5

## 2015-01-31 MED ORDER — SODIUM CHLORIDE 0.9 % IV SOLN
INTRAVENOUS | Status: DC
  Administered 2015-01-31: 13:00:00 via INTRAVENOUS

## 2015-01-31 SURGICAL SUPPLY — 65 items
BAG DECANTER FOR FLEXI CONT (MISCELLANEOUS) ×3 IMPLANT
BAG SPEC THK2 15X12 ZIP CLS (MISCELLANEOUS) ×1
BAG ZIPLOCK 12X15 (MISCELLANEOUS) ×3 IMPLANT
BANDAGE ELASTIC 6 VELCRO ST LF (GAUZE/BANDAGES/DRESSINGS) ×3 IMPLANT
BANDAGE ESMARK 6X9 LF (GAUZE/BANDAGES/DRESSINGS) ×1 IMPLANT
BLADE SAG 18X100X1.27 (BLADE) ×3 IMPLANT
BLADE SAW SGTL 11.0X1.19X90.0M (BLADE) ×3 IMPLANT
BNDG CMPR 9X6 STRL LF SNTH (GAUZE/BANDAGES/DRESSINGS) ×1
BNDG ESMARK 6X9 LF (GAUZE/BANDAGES/DRESSINGS) ×3
BOWL SMART MIX CTS (DISPOSABLE) ×3 IMPLANT
CAP KNEE TOTAL 3 SIGMA ×2 IMPLANT
CEMENT HV SMART SET (Cement) ×6 IMPLANT
CLOSURE WOUND 1/2 X4 (GAUZE/BANDAGES/DRESSINGS) ×1
CUFF TOURN SGL QUICK 34 (TOURNIQUET CUFF) ×3
CUFF TRNQT CYL 34X4X40X1 (TOURNIQUET CUFF) ×1 IMPLANT
DECANTER SPIKE VIAL GLASS SM (MISCELLANEOUS) ×3 IMPLANT
DRAPE EXTREMITY T 121X128X90 (DRAPE) ×3 IMPLANT
DRAPE POUCH INSTRU U-SHP 10X18 (DRAPES) ×3 IMPLANT
DRAPE U-SHAPE 47X51 STRL (DRAPES) ×3 IMPLANT
DRSG ADAPTIC 3X8 NADH LF (GAUZE/BANDAGES/DRESSINGS) ×3 IMPLANT
DRSG PAD ABDOMINAL 8X10 ST (GAUZE/BANDAGES/DRESSINGS) ×1 IMPLANT
DURAPREP 26ML APPLICATOR (WOUND CARE) ×3 IMPLANT
ELECT REM PT RETURN 9FT ADLT (ELECTROSURGICAL) ×3
ELECTRODE REM PT RTRN 9FT ADLT (ELECTROSURGICAL) ×1 IMPLANT
EVACUATOR 1/8 PVC DRAIN (DRAIN) ×3 IMPLANT
FACESHIELD WRAPAROUND (MASK) ×15 IMPLANT
FACESHIELD WRAPAROUND OR TEAM (MASK) ×5 IMPLANT
GAUZE SPONGE 4X4 12PLY STRL (GAUZE/BANDAGES/DRESSINGS) ×3 IMPLANT
GLOVE BIO SURGEON STRL SZ7.5 (GLOVE) IMPLANT
GLOVE BIO SURGEON STRL SZ8 (GLOVE) ×3 IMPLANT
GLOVE BIOGEL PI IND STRL 6.5 (GLOVE) IMPLANT
GLOVE BIOGEL PI IND STRL 8 (GLOVE) ×1 IMPLANT
GLOVE BIOGEL PI INDICATOR 6.5 (GLOVE)
GLOVE BIOGEL PI INDICATOR 8 (GLOVE) ×2
GLOVE SURG SS PI 6.5 STRL IVOR (GLOVE) IMPLANT
GOWN STRL REUS W/TWL LRG LVL3 (GOWN DISPOSABLE) ×3 IMPLANT
GOWN STRL REUS W/TWL XL LVL3 (GOWN DISPOSABLE) IMPLANT
HANDPIECE INTERPULSE COAX TIP (DISPOSABLE) ×3
IMMOBILIZER KNEE 20 (SOFTGOODS) ×2 IMPLANT
IMMOBILIZER KNEE 20 THIGH 36 (SOFTGOODS) ×1 IMPLANT
KIT BASIN OR (CUSTOM PROCEDURE TRAY) ×3 IMPLANT
MANIFOLD NEPTUNE II (INSTRUMENTS) ×3 IMPLANT
NDL SAFETY ECLIPSE 18X1.5 (NEEDLE) ×2 IMPLANT
NEEDLE HYPO 18GX1.5 SHARP (NEEDLE) ×6
NS IRRIG 1000ML POUR BTL (IV SOLUTION) ×3 IMPLANT
PACK TOTAL JOINT (CUSTOM PROCEDURE TRAY) ×3 IMPLANT
PAD ABD 8X10 STRL (GAUZE/BANDAGES/DRESSINGS) ×2 IMPLANT
PADDING CAST COTTON 6X4 STRL (CAST SUPPLIES) ×5 IMPLANT
PEN SKIN MARKING BROAD (MISCELLANEOUS) ×3 IMPLANT
POSITIONER SURGICAL ARM (MISCELLANEOUS) ×3 IMPLANT
SET HNDPC FAN SPRY TIP SCT (DISPOSABLE) ×1 IMPLANT
STRIP CLOSURE SKIN 1/2X4 (GAUZE/BANDAGES/DRESSINGS) ×3 IMPLANT
SUCTION FRAZIER 12FR DISP (SUCTIONS) ×3 IMPLANT
SUT MNCRL AB 4-0 PS2 18 (SUTURE) ×3 IMPLANT
SUT VIC AB 2-0 CT1 27 (SUTURE) ×9
SUT VIC AB 2-0 CT1 TAPERPNT 27 (SUTURE) ×3 IMPLANT
SUT VLOC 180 0 24IN GS25 (SUTURE) ×3 IMPLANT
SYR 20CC LL (SYRINGE) ×3 IMPLANT
SYR 50ML LL SCALE MARK (SYRINGE) ×3 IMPLANT
TOWEL OR 17X26 10 PK STRL BLUE (TOWEL DISPOSABLE) ×3 IMPLANT
TOWEL OR NON WOVEN STRL DISP B (DISPOSABLE) ×2 IMPLANT
TRAY FOLEY CATH 14FRSI W/METER (CATHETERS) ×1 IMPLANT
WATER STERILE IRR 1500ML POUR (IV SOLUTION) ×3 IMPLANT
WRAP KNEE MAXI GEL POST OP (GAUZE/BANDAGES/DRESSINGS) ×3 IMPLANT
YANKAUER SUCT BULB TIP 10FT TU (MISCELLANEOUS) ×3 IMPLANT

## 2015-01-31 NOTE — Progress Notes (Signed)
Utilization review completed.  

## 2015-01-31 NOTE — Interval H&P Note (Signed)
History and Physical Interval Note:  01/31/2015 6:45 AM  Ryan Meza  has presented today for surgery, with the diagnosis of right knee osteoarthritis  The various methods of treatment have been discussed with the patient and family. After consideration of risks, benefits and other options for treatment, the patient has consented to  Procedure(s): RIGHT TOTAL KNEE ARTHROPLASTY (Right) as a surgical intervention .  The patient's history has been reviewed, patient examined, no change in status, stable for surgery.  I have reviewed the patient's chart and labs.  Questions were answered to the patient's satisfaction.     Loanne DrillingALUISIO,Melat Wrisley V

## 2015-01-31 NOTE — Anesthesia Preprocedure Evaluation (Addendum)
Anesthesia Evaluation  Patient identified by MRN, date of birth, ID band Patient awake    Reviewed: Allergy & Precautions, NPO status , Patient's Chart, lab work & pertinent test results  History of Anesthesia Complications Negative for: history of anesthetic complications  Airway Mallampati: II  TM Distance: >3 FB Neck ROM: Full    Dental no notable dental hx. (+) Dental Advisory Given, Edentulous Upper, Edentulous Lower   Pulmonary former smoker,  breath sounds clear to auscultation  Pulmonary exam normal       Cardiovascular hypertension, Pt. on medications and Pt. on home beta blockers Rhythm:Regular Rate:Normal     Neuro/Psych negative neurological ROS  negative psych ROS   GI/Hepatic negative GI ROS, Neg liver ROS, GERD-  Medicated and Controlled,  Endo/Other  negative endocrine ROS  Renal/GU negative Renal ROS  negative genitourinary   Musculoskeletal  (+) Arthritis -, Osteoarthritis,    Abdominal   Peds negative pediatric ROS (+)  Hematology negative hematology ROS (+)   Anesthesia Other Findings   Reproductive/Obstetrics negative OB ROS                            Anesthesia Physical Anesthesia Plan  ASA: II  Anesthesia Plan: Spinal   Post-op Pain Management:    Induction: Intravenous  Airway Management Planned: Nasal Cannula  Additional Equipment:   Intra-op Plan:   Post-operative Plan:   Informed Consent: I have reviewed the patients History and Physical, chart, labs and discussed the procedure including the risks, benefits and alternatives for the proposed anesthesia with the patient or authorized representative who has indicated his/her understanding and acceptance.   Dental advisory given  Plan Discussed with: CRNA  Anesthesia Plan Comments:         Anesthesia Quick Evaluation

## 2015-01-31 NOTE — Anesthesia Procedure Notes (Addendum)
Procedure Name: LMA Insertion Date/Time: 01/31/2015 7:23 AM Performed by: Maxwell Caul Pre-anesthesia Checklist: Patient identified, Emergency Drugs available, Suction available, Patient being monitored and Timeout performed Patient Re-evaluated:Patient Re-evaluated prior to inductionOxygen Delivery Method: Circle system utilized Preoxygenation: Pre-oxygenation with 100% oxygen Intubation Type: IV induction Ventilation: Mask ventilation without difficulty LMA: LMA inserted LMA Size: 4.0 Number of attempts: 1 Tube secured with: Tape Dental Injury: Teeth and Oropharynx as per pre-operative assessment  Comments: Patient complains of pain with incision. Dr Jillyn Hidden called to room and LMA #4 gently placed via Alfredo Martinez.     Spinal Patient location during procedure: OR End time: 01/31/2015 7:05 AM Staffing Anesthesiologist: Lauretta Grill Resident/CRNA: Maxwell Caul Performed by: resident/CRNA and anesthesiologist  Preanesthetic Checklist Completed: patient identified, site marked, surgical consent, pre-op evaluation, timeout performed, IV checked, risks and benefits discussed and monitors and equipment checked Spinal Block Patient position: sitting Prep: Betadine Patient monitoring: heart rate, continuous pulse ox and blood pressure Injection technique: single-shot Needle Needle type: Sprotte  Needle gauge: 24 G Needle length: 9 cm Additional Notes Expiration date of kit checked and confirmed. Patient tolerated procedure well, without complications.

## 2015-01-31 NOTE — Transfer of Care (Signed)
Immediate Anesthesia Transfer of Care Note  Patient: Ryan Meza  Procedure(s) Performed: Procedure(s): RIGHT TOTAL KNEE ARTHROPLASTY (Right)  Patient Location: PACU  Anesthesia Type:General and SAB failed - convert to General  Level of Consciousness: awake, oriented and patient cooperative  Airway & Oxygen Therapy: Patient Spontanous Breathing and Patient connected to face mask oxygen  Post-op Assessment: Report given to RN and Post -op Vital signs reviewed and stable  Post vital signs: Reviewed and stable  Last Vitals:  Filed Vitals:   01/31/15 0459  BP: 144/86  Pulse: 65  Temp: 36.3 C  Resp: 18    Complications: No apparent anesthesia complications

## 2015-01-31 NOTE — Anesthesia Postprocedure Evaluation (Signed)
  Anesthesia Post-op Note  Patient: Ryan Meza  Procedure(s) Performed: Procedure(s) (LRB): RIGHT TOTAL KNEE ARTHROPLASTY (Right)  Patient Location: PACU  Anesthesia Type: General  Level of Consciousness: awake and alert   Airway and Oxygen Therapy: Patient Spontanous Breathing  Post-op Pain: mild  Post-op Assessment: Post-op Vital signs reviewed, Patient's Cardiovascular Status Stable, Respiratory Function Stable, Patent Airway and No signs of Nausea or vomiting  Last Vitals:  Filed Vitals:   01/31/15 1229  BP: 145/72  Pulse: 48  Temp: 36.8 C  Resp: 16    Post-op Vital Signs: stable   Complications: No apparent anesthesia complications

## 2015-01-31 NOTE — Plan of Care (Signed)
Problem: Consults Goal: Diagnosis- Total Joint Replacement Right total knee     

## 2015-01-31 NOTE — Evaluation (Signed)
Physical Therapy Evaluation Patient Details Name: Ryan Meza MRN: [id number] DOB: [birth year] Today's Date: 01/31/2015   History of Present Illness  Pt is 74 y/o male s/p R TKA with hx L TKA  Clinical Impression  Patient is s/p R TKA resulting in the deficits listed below (see PT Problem List). Pt was able to complete bed mobility, transfers and ambulate 40 ft with min guard. Pt will benefit from skilled PT to increase their independence and safety with mobility to allow discharge home.     Follow Up Recommendations Home health PT    Equipment Recommendations  None recommended by PT    Recommendations for Other Services       Precautions / Restrictions Precautions Precautions: Fall;Knee Precaution Comments: s/p R TKA Required Braces or Orthoses: Knee Immobilizer - Right Knee Immobilizer - Right: Discontinue once straight leg raise with < 10 degree lag Restrictions Weight Bearing Restrictions: No Other Position/Activity Restrictions: WBAT      Mobility  Bed Mobility Overal bed mobility: Needs Assistance Bed Mobility: Supine to Sit     Supine to sit: Min guard     General bed mobility comments: pt required increased time and used bed rail to help sit up.   Transfers Overall transfer level: Needs assistance Equipment used: Rolling walker (2 wheeled) Transfers: Sit to/from Stand Sit to Stand: Min guard         General transfer comment: verbal cues for R LE placement  Ambulation/Gait Ambulation/Gait assistance: Min guard Ambulation Distance (Feet): 40 Feet Assistive device: Rolling walker (2 wheeled) Gait Pattern/deviations: Step-to pattern;Antalgic     General Gait Details: verbal cues given for sequence. pt UE strength is sufficient to support body weight during ambulation.   Stairs            Wheelchair Mobility    Modified Rankin (Stroke Patients Only)       Balance                                              Pertinent Vitals/Pain Pain Assessment: 0-10 Pain Score: 3  Pain Location: R Knee Pain Descriptors / Indicators: Sore;Aching Pain Intervention(s): Limited activity within patient's tolerance;Monitored during session;Ice applied;Repositioned    Home Living Family/patient expects to be discharged to:: Private residence Living Arrangements: Spouse/significant other   Type of Home: House Home Access: Stairs to enter Entrance Stairs-Rails: None Entrance Stairs-Number of Steps: 1 Home Layout: One level Home Equipment: Environmental consultant - 2 wheels;Cane - single point      Prior Function Level of Independence: Independent               Hand Dominance        Extremity/Trunk Assessment               Lower Extremity Assessment: RLE deficits/detail RLE Deficits / Details: fair quad contraction, maintained KI       Communication   Communication: No difficulties  Cognition Arousal/Alertness: Awake/alert Behavior During Therapy: WFL for tasks assessed/performed Overall Cognitive Status: Within Functional Limits for tasks assessed                      General Comments      Exercises        Assessment/Plan    PT Assessment Patient needs continued PT services  PT Diagnosis Difficulty walking;Acute pain   PT Problem  List Decreased strength;Decreased range of motion;Decreased mobility;Decreased knowledge of use of DME;Pain  PT Treatment Interventions DME instruction;Gait training;Stair training;Functional mobility training;Therapeutic exercise;Therapeutic activities;Patient/family education   PT Goals (Current goals can be found in the Care Plan section) Acute Rehab PT Goals PT Goal Formulation: With patient Time For Goal Achievement: 02/14/15 Potential to Achieve Goals: Good    Frequency 7X/week   Barriers to discharge        Co-evaluation               End of Session Equipment Utilized During Treatment: Gait belt;Right knee  immobilizer Activity Tolerance: Patient tolerated treatment well Patient left: in chair;with call bell/phone within reach;with family/visitor present Nurse Communication: Mobility status         Time: [id number] PT Time Calculation (min) (ACUTE ONLY): 18 min   Charges:   PT Evaluation $Initial PT Evaluation Tier I: 1 Procedure     PT G Codes:        Yosgar Demirjian 01/31/2015, 4:22 PM Netty StarringJames Donta Fuster, SPT

## 2015-01-31 NOTE — H&P (View-Only) (Signed)
Ryan Meza DOB: [birth year] Married / Language: English / Race: White Male Date of Admission:  01/31/2015 CC:  Right knee pain History of Present Illness The patient is a 74 year old male who comes in for a preoperative History and Physical. The patient is scheduled for a right total knee arthroplasty to be performed by Dr. Gus Meza. Aluisio, MD at [facility] on 01-31-2015. The patient is a 74 year old male presenting for a post-operative visit. The patient comes in a couple months out from left total knee arthroplasty. The patient states that he is doing very well at this time. The pain is under excellent control at this time and describe their pain as mild. They are currently on Tylenol for their pain. The patient is currently doing outpatient physical therapy. The patient feels that they are progressing well at this time. He is real pleased with how his knee is doing. Pain is greatly improved from preoperative. Now, the opposite right knee continues to hurt but not quite as bad as the left one prior to its surgery. However, the right knee does hurt and has gotten progressively worse lately. He is able to walk and do his rehab for the left total knee but he is ready to go ahead and proceed with the right knee at this time. They have been treated conservatively in the past for the above stated problem and despite conservative measures, they continue to have progressive pain and severe functional limitations and dysfunction. They have failed non-operative management including home exercise, medications, and injections. It is felt that they would benefit from undergoing total joint replacement. Risks and benefits of the procedure have been discussed with the patient and they elect to proceed with surgery. There are no active contraindications to surgery such as ongoing infection or rapidly progressive neurological disease.   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  Social History  Never consumed alcohol 04/27/2014: Never consumed alcohol Number of flights of stairs before winded 2-3 Marital status married No history of drug/alcohol rehab Not under pain contract Tobacco / smoke exposure 04/27/2014: no Exercise Exercises rarely; does other Children 2 Current work status retired Tobacco use Former smoker. 04/27/2014: smoke(d) 1 1/2 pack(s) per day Living situation live with spouse  Medication History  Combigan (0.2-0.5% Solution, Ophthalmic) Active. Lumigan (0.01% Solution, Ophthalmic) Active. Atorvastatin Calcium (  Tablet, Oral) Active. Lisinopril (  Tablet, Oral) Active. Omeprazole (  Tablet DR, Oral) Active. Vitamin D3 (1000UNIT Tablet, Oral) Active. Multivitamin (Oral) Active. Aspirin EC Low Strength (  Tablet DR, Oral) Active.  Past Surgical History Cataract Surgery right Prostatectomy; Transurethral   Review of Systems General Not Present- Chills, Fatigue, Fever, Memory Loss, Night Sweats, Weight Gain and Weight Loss. Skin Not Present- Eczema, Hives, Itching, Lesions and Rash. HEENT Not Present- Dentures, Double Vision, Headache, Hearing Loss, Tinnitus and Visual Loss. Respiratory Not Present- Allergies, Chronic Cough, Coughing up blood, Shortness of breath at rest and Shortness of breath with exertion. Cardiovascular Not Present- Chest Pain, Difficulty Breathing Lying Down, Murmur, Palpitations, Racing/skipping heartbeats and Swelling. Gastrointestinal Not Present- Abdominal Pain, Bloody Stool, Constipation, Diarrhea, Difficulty Swallowing, Heartburn, Jaundice, Loss of appetitie, Nausea and Vomiting. Male Genitourinary Not Present- Blood in Urine, Discharge, Flank Pain,  Incontinence, Painful Urination, Urgency, Urinary frequency, Urinary Retention, Urinating at Night and Weak urinary stream. Musculoskeletal Present-  Joint Pain. Not Present- Back Pain, Joint Swelling, Morning Stiffness, Muscle Pain, Muscle Weakness and Spasms. Neurological Not Present- Blackout spells, Difficulty with balance, Dizziness, Paralysis, Tremor and Weakness. Psychiatric Not Present- Insomnia.  Vitals  Weight: 171 lb Height: 71.5in Weight was reported by patient. Height was reported by patient. Body Surface Area: 1.98 m Body Mass Index: 23.52 kg/m  BP: 128/70 (Sitting, Right Arm, Standard)  Physical Exam  General Mental Status -Alert, cooperative and good historian. General Appearance-pleasant, Not in acute distress. Orientation-Oriented X3. Build & Nutrition-Well nourished and Well developed.  Head and Neck Head-normocephalic, atraumatic . Neck Global Assessment - supple, no bruit auscultated on the right, no bruit auscultated on the left. Note: upper and lower dentures   Eye Vision-Wears corrective lenses. Pupil - Bilateral-Regular and Round. Motion - Bilateral-EOMI.  Chest and Lung Exam Auscultation Breath sounds - clear at anterior chest wall and clear at posterior chest wall. Adventitious sounds - No Adventitious sounds.  Cardiovascular Auscultation Rhythm - Regular rate and rhythm. Heart Sounds - S1 WNL and S2 WNL. Murmurs & Other Heart Sounds - Auscultation of the heart reveals - No Murmurs.  Abdomen Palpation/Percussion Tenderness - Abdomen is non-tender to palpation. Rigidity (guarding) - Abdomen is soft. Auscultation Auscultation of the abdomen reveals - Bowel sounds normal.  Male Genitourinary Note: Not done, not pertinent to present illness   Musculoskeletal Note: His right knee has severe varus. Range is 5-130. Marked crepitus on range of motion. Tender medial greater than lateral with no instability. Left  Knee: Wound/Incision - Appearance - The incision is well healed with no erythema or exudates. ROM: Flexion - AROM - 120 . PROM - 130 . Extension - AROM - 5 . PROM - 0 . Gait and Station: Evaluation of Gait - Gait Pattern - normal gait pattern. Gait and Station - [employer] - no assistive devices.   Assessment & Plan (Ryan Maday L. Kory Panjwani III PA-C; 01/13/2015 11:08 AM) Primary localized osteoarthritis of right knee (M17.11) Status post total left knee replacement (Z61.096(Z96.652) Note:Surgical Plans: Right Total Knee Repalcement  Disposition: Home  PCP: Dr. Rodrigo RanMark Perini  IV TXA  Anesthesia Issues: None  Signed electronically by Ryan RinneAlexzandrew L Feleica Fulmore, III PA-C

## 2015-01-31 NOTE — Op Note (Signed)
Pre-operative diagnosis- Osteoarthritis  Right knee(s)  Post-operative diagnosis- Osteoarthritis Right knee(s)  Procedure-  Right  Total Knee Arthroplasty  Surgeon- Gus Rankin. Ronnald Shedden, MD  Assistant- Dimitri Ped, PA-C   Anesthesia-  General and Spinal  EBL-* No blood loss amount entered *   Drains Hemovac  Tourniquet time-  Total Tourniquet Time Documented: Thigh (Right) - 32 minutes Total: Thigh (Right) - 32 minutes     Complications- None  Condition-PACU - hemodynamically stable.   Brief Clinical Note  Ryan Meza is a 74 y.o. year old male with end stage OA of his right knee with progressively worsening pain and dysfunction. He has constant pain, with activity and at rest and significant functional deficits with difficulties even with ADLs. He has had extensive non-op management including analgesics, injections of cortisone and viscosupplements, and home exercise program, but remains in significant pain with significant dysfunction. Radiographs show bone on bone arthritis medial and patellofemoral. He presents now for right Total Knee Arthroplasty.    Procedure in detail---   The patient is brought into the operating room and positioned supine on the operating table. After successful administration of  General and Spinal,   a tourniquet is placed high on the  Right thigh(s) and the lower extremity is prepped and draped in the usual sterile fashion. Time out is performed by the operating team and then the  Right lower extremity is wrapped in Esmarch, knee flexed and the tourniquet inflated to 300 mmHg.       A midline incision is made with a ten blade through the subcutaneous tissue to the level of the extensor mechanism. A fresh blade is used to make a medial parapatellar arthrotomy. Soft tissue over the proximal medial tibia is subperiosteally elevated to the joint line with a knife and into the semimembranosus bursa with a Cobb elevator. Soft tissue over the proximal  lateral tibia is elevated with attention being paid to avoiding the patellar tendon on the tibial tubercle. The patella is everted, knee flexed 90 degrees and the ACL and PCL are removed. Findings are  bone on bone arthritis medial and patellofemoral with large global osteophytes.        The drill is used to create a starting hole in the distal femur and the canal is thoroughly irrigated with sterile saline to remove the fatty contents. The 5 degree Right  valgus alignment guide is placed into the femoral canal and the distal femoral cutting block is pinned to remove 10 mm off the distal femur. Resection is made with an oscillating saw.      The tibia is subluxed forward and the menisci are removed. The extramedullary alignment guide is placed referencing proximally at the medial aspect of the tibial tubercle and distally along the second metatarsal axis and tibial crest. The block is pinned to remove 2mm off the more deficient medial  side. Resection is made with an oscillating saw. Size 5is the most appropriate size for the tibia and the proximal tibia is prepared with the modular drill and keel punch for that size.      The femoral sizing guide is placed and size 5 is most appropriate. Rotation is marked off the epicondylar axis and confirmed by creating a rectangular flexion gap at 90 degrees. The size 5 cutting block is pinned in this rotation and the anterior, posterior and chamfer cuts are made with the oscillating saw. The intercondylar block is then placed and that cut is made.  Trial size 5 tibial component, trial size 5 posterior stabilized femur and a 12.5  mm posterior stabilized rotating platform insert trial is placed. Full extension is achieved with excellent varus/valgus and anterior/posterior balance throughout full range of motion. The patella is everted and thickness measured to be 27  mm. Free hand resection is taken to 15 mm, a 41 template is placed, lug holes are drilled, trial patella  is placed, and it tracks normally. Osteophytes are removed off the posterior femur with the trial in place. All trials are removed and the cut bone surfaces prepared with pulsatile lavage. Cement is mixed and once ready for implantation, the size 5 tibial implant, size  5 posterior stabilized femoral component, and the size 41 patella are cemented in place and the patella is held with the clamp. The trial insert is placed and the knee held in full extension. The Exparel (20 ml mixed with 30 ml saline) and .25% Bupivicaine, are injected into the extensor mechanism, posterior capsule, medial and lateral gutters and subcutaneous tissues.  All extruded cement is removed and once the cement is hard the permanent 12.5 mm posterior stabilized rotating platform insert is placed into the tibial tray.      The wound is copiously irrigated with saline solution and the extensor mechanism closed over a hemovac drain with #1 V-loc suture. The tourniquet is released for a total tourniquet time of 32  minutes. Flexion against gravity is 140 degrees and the patella tracks normally. Subcutaneous tissue is closed with 2.0 vicryl and subcuticular with running 4.0 Monocryl. The incision is cleaned and dried and steri-strips and a bulky sterile dressing are applied. The limb is placed into a knee immobilizer and the patient is awakened and transported to recovery in stable condition.      Please note that a surgical assistant was a medical necessity for this procedure in order to perform it in a safe and expeditious manner. Surgical assistant was necessary to retract the ligaments and vital neurovascular structures to prevent injury to them and also necessary for proper positioning of the limb to allow for anatomic placement of the prosthesis.   Gus RankinFrank V. Syniah Berne, MD    01/31/2015, 8:00 AM

## 2015-02-01 LAB — CBC
HCT: 35.9 % — ABNORMAL LOW (ref 39.0–52.0)
Hemoglobin: 11.4 g/dL — ABNORMAL LOW (ref 13.0–17.0)
MCH: 27.5 pg (ref 26.0–34.0)
MCHC: 31.8 g/dL (ref 30.0–36.0)
MCV: 86.7 fL (ref 78.0–100.0)
Platelets: 194 10*3/uL (ref 150–400)
RBC: 4.14 MIL/uL — ABNORMAL LOW (ref 4.22–5.81)
RDW: 13.6 % (ref 11.5–15.5)
WBC: 12.4 10*3/uL — ABNORMAL HIGH (ref 4.0–10.5)

## 2015-02-01 LAB — BASIC METABOLIC PANEL
Anion gap: 7 (ref 5–15)
BUN: 14 mg/dL (ref 6–23)
CHLORIDE: 106 mmol/L (ref 96–112)
CO2: 25 mmol/L (ref 19–32)
Calcium: 8.7 mg/dL (ref 8.4–10.5)
Creatinine, Ser: 0.91 mg/dL (ref 0.50–1.35)
GFR calc non Af Amer: 81 mL/min — ABNORMAL LOW (ref >90)
Glucose, Bld: 146 mg/dL — ABNORMAL HIGH (ref 70–99)
POTASSIUM: 4.1 mmol/L (ref 3.5–5.1)
SODIUM: 138 mmol/L (ref 135–145)

## 2015-02-01 MED ORDER — RIVAROXABAN 10 MG PO TABS: 10.0000 mg | ORAL_TABLET | Freq: Every day | ORAL | Status: AC

## 2015-02-01 MED ORDER — TRAMADOL HCL 50 MG PO TABS: 50.0000 mg | ORAL_TABLET | Freq: Four times a day (QID) | ORAL | Status: AC | PRN

## 2015-02-01 MED ORDER — OXYCODONE HCL 5 MG PO TABS: 5.0000 mg | ORAL_TABLET | ORAL | Status: AC | PRN

## 2015-02-01 MED ORDER — OMEPRAZOLE 20 MG PO CPDR
20.0000 mg | DELAYED_RELEASE_CAPSULE | Freq: Every day | ORAL | Status: DC
  Administered 2015-02-01: 20 mg via ORAL
  Filled 2015-02-01: qty 1

## 2015-02-01 MED ORDER — METHOCARBAMOL 500 MG PO TABS: 500.0000 mg | ORAL_TABLET | Freq: Four times a day (QID) | ORAL | Status: AC | PRN

## 2015-02-01 NOTE — Discharge Summary (Signed)
Physician Discharge Summary   Patient ID: Ryan Meza MRN: [id number] DOB/AGE: 1940-11-07 74 y.o.  Admit date: 01/31/2015 Discharge date: 02/01/2015  Primary Diagnosis:  Osteoarthritis Right knee(s)  Admission Diagnoses:  Past Medical History  Diagnosis Date  . Hyperlipidemia     under control  . GERD (gastroesophageal reflux disease)   . Wears glasses   . Full dentures   . Arthritis   . Double vision     occasional  . Glaucoma     right eye  . Hypertension     under control  . History of blood transfusion 2013    2 unit, no reaction   Discharge Diagnoses:   Principal Problem:   OA (osteoarthritis) of knee  Estimated body mass index is 24.84 kg/(m^2) as calculated from the following:   Height as of this encounter: '5\' 11"'  (1.803 m).   Weight as of this encounter: 80.74 kg (178 lb).  Procedure:  Procedure(s) (LRB): RIGHT TOTAL KNEE ARTHROPLASTY (Right)   Consults: None  HPI: Ryan Meza is a 74 y.o. year old male with end stage OA of his right knee with progressively worsening pain and dysfunction. He has constant pain, with activity and at rest and significant functional deficits with difficulties even with ADLs. He has had extensive non-op management including analgesics, injections of cortisone and viscosupplements, and home exercise program, but remains in significant pain with significant dysfunction. Radiographs show bone on bone arthritis medial and patellofemoral. He presents now for right Total Knee Arthroplasty.  Laboratory Data: Admission on 01/31/2015, Discharged on 02/01/2015  Component Date Value Ref Range Status  . WBC 02/01/2015 12.4* 4.0 - 10.5 K/uL Final  . RBC 02/01/2015 4.14* 4.22 - 5.81 MIL/uL Final  . Hemoglobin 02/01/2015 11.4* 13.0 - 17.0 g/dL Final  . HCT 02/01/2015 35.9* 39.0 - 52.0 % Final  . MCV 02/01/2015 86.7  78.0 - 100.0 fL Final  . MCH 02/01/2015 27.5  26.0 - 34.0 pg Final  . MCHC 02/01/2015 31.8  30.0 - 36.0 g/dL Final  .  RDW 02/01/2015 13.6  11.5 - 15.5 % Final  . Platelets 02/01/2015 194  150 - 400 K/uL Final  . Sodium 02/01/2015 138  135 - 145 mmol/L Final  . Potassium 02/01/2015 4.1  3.5 - 5.1 mmol/L Final  . Chloride 02/01/2015 106  96 - 112 mmol/L Final  . CO2 02/01/2015 25  19 - 32 mmol/L Final  . Glucose, Bld 02/01/2015 146* 70 - 99 mg/dL Final  . BUN 02/01/2015 14  6 - 23 mg/dL Final  . Creatinine, Ser 02/01/2015 0.91  0.50 - 1.35 mg/dL Final  . Calcium 02/01/2015 8.7  8.4 - 10.5 mg/dL Final  . GFR calc non Af Amer 02/01/2015 81* >90 mL/min Final  . GFR calc Af Amer 02/01/2015 >90  >90 mL/min Final   Comment: (NOTE) The eGFR has been calculated using the CKD EPI equation. This calculation has not been validated in all clinical situations. eGFR's persistently <90 mL/min signify possible Chronic Kidney Disease.   Georgiann Hahn gap 02/01/2015 7  5 - 15 Final  Hospital Outpatient Visit on 01/19/2015  Component Date Value Ref Range Status  . MRSA, PCR 01/19/2015 NEGATIVE  NEGATIVE Final  . Staphylococcus aureus 01/19/2015 NEGATIVE  NEGATIVE Final   Comment:        The Xpert SA Assay (FDA approved for NASAL specimens in patients over 81 years of age), is one component of a comprehensive surveillance program.  Test performance has been  validated by [facility] for patients greater than or equal to 48 year old. It is not intended to diagnose infection nor to guide or monitor treatment.   Marland Kitchen aPTT 01/19/2015 33  24 - 37 seconds Final  . WBC 01/19/2015 6.8  4.0 - 10.5 K/uL Final  . RBC 01/19/2015 4.91  4.22 - 5.81 MIL/uL Final  . Hemoglobin 01/19/2015 13.4  13.0 - 17.0 g/dL Final  . HCT 01/19/2015 42.4  39.0 - 52.0 % Final  . MCV 01/19/2015 86.4  78.0 - 100.0 fL Final  . MCH 01/19/2015 27.3  26.0 - 34.0 pg Final  . MCHC 01/19/2015 31.6  30.0 - 36.0 g/dL Final  . RDW 01/19/2015 13.7  11.5 - 15.5 % Final  . Platelets 01/19/2015 251  150 - 400 K/uL Final  . Sodium 01/19/2015 139  135 - 145 mmol/L  Final  . Potassium 01/19/2015 4.2  3.5 - 5.1 mmol/L Final  . Chloride 01/19/2015 106  96 - 112 mmol/L Final  . CO2 01/19/2015 26  19 - 32 mmol/L Final  . Glucose, Bld 01/19/2015 106* 70 - 99 mg/dL Final  . BUN 01/19/2015 17  6 - 23 mg/dL Final  . Creatinine, Ser 01/19/2015 1.04  0.50 - 1.35 mg/dL Final  . Calcium 01/19/2015 9.7  8.4 - 10.5 mg/dL Final  . Total Protein 01/19/2015 7.5  6.0 - 8.3 g/dL Final  . Albumin 01/19/2015 4.0  3.5 - 5.2 g/dL Final  . AST 01/19/2015 22  0 - 37 U/L Final  . ALT 01/19/2015 17  0 - 53 U/L Final  . Alkaline Phosphatase 01/19/2015 103  39 - 117 U/L Final  . Total Bilirubin 01/19/2015 0.7  0.3 - 1.2 mg/dL Final  . GFR calc non Af Amer 01/19/2015 68* >90 mL/min Final  . GFR calc Af Amer 01/19/2015 79* >90 mL/min Final   Comment: (NOTE) The eGFR has been calculated using the CKD EPI equation. This calculation has not been validated in all clinical situations. eGFR's persistently <90 mL/min signify possible Chronic Kidney Disease.   . Anion gap 01/19/2015 7  5 - 15 Final  . Prothrombin Time 01/19/2015 12.5  11.6 - 15.2 seconds Final  . INR 01/19/2015 0.93  0.00 - 1.49 Final  . Color, Urine 01/19/2015 YELLOW  YELLOW Final  . APPearance 01/19/2015 CLEAR  CLEAR Final  . Specific Gravity, Urine 01/19/2015 1.011  1.005 - 1.030 Final  . pH 01/19/2015 7.0  5.0 - 8.0 Final  . Glucose, UA 01/19/2015 NEGATIVE  NEGATIVE mg/dL Final  . Hgb urine dipstick 01/19/2015 NEGATIVE  NEGATIVE Final  . Bilirubin Urine 01/19/2015 NEGATIVE  NEGATIVE Final  . Ketones, ur 01/19/2015 NEGATIVE  NEGATIVE mg/dL Final  . Protein, ur 01/19/2015 NEGATIVE  NEGATIVE mg/dL Final  . Urobilinogen, UA 01/19/2015 0.2  0.0 - 1.0 mg/dL Final  . Nitrite 01/19/2015 NEGATIVE  NEGATIVE Final  . Leukocytes, UA 01/19/2015 NEGATIVE  NEGATIVE Final   MICROSCOPIC NOT DONE ON URINES WITH NEGATIVE PROTEIN, BLOOD, LEUKOCYTES, NITRITE, OR GLUCOSE <1000 mg/dL.     X-Rays:No results found.  EKG: Orders  placed or performed during the hospital encounter of 06/04/14  . EKG 12 lead  . EKG 12 lead     Hospital Course: Ryan Meza is a 74 y.o. who was admitted to [facility]. They were brought to the operating room on 01/31/2015 and underwent Procedure(s): RIGHT TOTAL KNEE ARTHROPLASTY.  Patient tolerated the procedure well and was later transferred to the recovery  room and then to the orthopaedic floor for postoperative care.  They were given PO and IV analgesics for pain control following their surgery.  They were given 24 hours of postoperative antibiotics of  Anti-infectives    Start     Dose/Rate Route Frequency Ordered Stop   01/31/15 1300  ceFAZolin (ANCEF) IVPB 2 g/50 mL premix     2 g 100 mL/hr over 30 Minutes Intravenous Every 6 hours 01/31/15 0944 01/31/15 1908   01/31/15 0504  ceFAZolin (ANCEF) IVPB 2 g/50 mL premix     2 g 100 mL/hr over 30 Minutes Intravenous On call to O.R. 01/31/15 1224 01/31/15 0707     and started on DVT prophylaxis in the form of Xarelto.   PT and OT were ordered for total joint protocol.  Discharge planning consulted to help with postop disposition and equipment needs.  Patient had a good night on the evening of surgery. The patient was doing well that morning. They started to get up OOB with therapy on day one. Hemovac drain was pulled without difficulty.  It was decided that if he did very well with therapy, that he could go home later that afternoon following therapy sessions.  Patient was seen in rounds and was setup to go home.  He walked over 200 feet the first and then 160 feet.  He met his goals and was discharged that same day.    DVT Prophylaxis - Xarelto Weight-Bearing as tolerated to right leg Diet - Cardiac diet Follow up - in 2 weeks Activity - WBAT Disposition - Plan is for home. D/C Meds - See DC Summary COD - good      Discharge Instructions    Call MD / Call 911    Complete by:  As directed   If you experience chest pain  or shortness of breath, CALL 911 and be transported to the hospital emergency room.  If you develope a fever above 101 F, pus (white drainage) or increased drainage or redness at the wound, or calf pain, call your surgeon's office.     Change dressing    Complete by:  As directed   Change dressing daily with sterile 4 x 4 inch gauze dressing and apply TED hose. Do not submerge the incision under water.     Constipation Prevention    Complete by:  As directed   Drink plenty of fluids.  Prune juice may be helpful.  You may use a stool softener, such as Colace (over the counter) 100 mg twice a day.  Use MiraLax (over the counter) for constipation as needed.     Diet - low sodium heart healthy    Complete by:  As directed      Discharge instructions    Complete by:  As directed   Pick up stool softner and laxative for home use following surgery while on pain medications. Do not submerge incision under water. Please use good hand washing techniques while changing dressing each day. May shower starting three days after surgery. Please use a clean towel to pat the incision dry following showers. Continue to use ice for pain and swelling after surgery. Do not use any lotions or creams on the incision until instructed by your surgeon.  Take Xarelto for two and a half more weeks, then discontinue Xarelto. Once the patient has completed the Xarelto, they may resume the 81 mg Aspirin.  Postoperative Constipation Protocol  Constipation - defined medically as fewer than three stools per  week and severe constipation as less than one stool per week.  One of the most common issues patients have following surgery is constipation.  Even if you have a regular bowel pattern at home, your normal regimen is likely to be disrupted due to multiple reasons following surgery.  Combination of anesthesia, postoperative narcotics, change in appetite and fluid intake all can affect your bowels.  In order to avoid  complications following surgery, here are some recommendations in order to help you during your recovery period.  Colace (docusate) - Pick up an over-the-counter form of Colace or another stool softener and take twice a day as long as you are requiring postoperative pain medications.  Take with a full glass of water daily.  If you experience loose stools or diarrhea, hold the colace until you stool forms back up.  If your symptoms do not get better within 1 week or if they get worse, check with your doctor.  Dulcolax (bisacodyl) - Pick up over-the-counter and take as directed by the product packaging as needed to assist with the movement of your bowels.  Take with a full glass of water.  Use this product as needed if not relieved by Colace only.   MiraLax (polyethylene glycol) - Pick up over-the-counter to have on hand.  MiraLax is a solution that will increase the amount of water in your bowels to assist with bowel movements.  Take as directed and can mix with a glass of water, juice, soda, coffee, or tea.  Take if you go more than two days without a movement. Do not use MiraLax more than once per day. Call your doctor if you are still constipated or irregular after using this medication for 7 days in a row.  If you continue to have problems with postoperative constipation, please contact the office for further assistance and recommendations.  If you experience "the worst abdominal pain ever" or develop nausea or vomiting, please contact the office immediatly for further recommendations for treatment.     Do not put a pillow under the knee. Place it under the heel.    Complete by:  As directed      Do not sit on low chairs, stoools or toilet seats, as it may be difficult to get up from low surfaces    Complete by:  As directed      Driving restrictions    Complete by:  As directed   No driving until released by the physician.     Increase activity slowly as tolerated    Complete by:  As directed        Lifting restrictions    Complete by:  As directed   No lifting until released by the physician.     Patient may shower    Complete by:  As directed   You may shower without a dressing once there is no drainage.  Do not wash over the wound.  If drainage remains, do not shower until drainage stops.     TED hose    Complete by:  As directed   Use stockings (TED hose) for 3 weeks on both leg(s).  You may remove them at night for sleeping.     Weight bearing as tolerated    Complete by:  As directed   Laterality:  right  Extremity:  Lower            Medication List    STOP taking these medications  aspirin EC 81 MG tablet     cholecalciferol 1000 UNITS tablet  Commonly known as:  VITAMIN D     multivitamin with minerals Tabs tablet      TAKE these medications        acetaminophen 500 MG tablet  Commonly known as:  TYLENOL  Take 500-1,000 mg by mouth every 6 (six) hours as needed for mild pain.     atorvastatin 80 MG tablet  Commonly known as:  LIPITOR  Take 40-80 mg by mouth every evening. take 61m on mon, wed, fri, and 40 mg on all other days     COMBIGAN 0.2-0.5 % ophthalmic solution  Generic drug:  brimonidine-timolol  Place 1 drop into the right eye 2 (two) times daily.     lisinopril 5 MG tablet  Commonly known as:  PRINIVIL,ZESTRIL  Take 5 mg by mouth at bedtime.     LUMIGAN 0.01 % Soln  Generic drug:  bimatoprost  Place 1 drop into the right eye at bedtime.     methocarbamol 500 MG tablet  Commonly known as:  ROBAXIN  Take 1 tablet (500 mg total) by mouth every 6 (six) hours as needed for muscle spasms.     omeprazole 20 MG capsule  Commonly known as:  PRILOSEC  Take 20 mg by mouth every morning.     oxyCODONE 5 MG immediate release tablet  Commonly known as:  Oxy IR/ROXICODONE  Take 1-2 tablets (5-10 mg total) by mouth every 3 (three) hours as needed for moderate pain, severe pain or breakthrough pain.     rivaroxaban 10 MG Tabs tablet   Commonly known as:  XARELTO  - Take 1 tablet (10 mg total) by mouth daily with breakfast. Take Xarelto for two and a half more weeks, then discontinue Xarelto.  - Once the patient has completed the Xarelto, they may resume the 81 mg Aspirin.     traMADol 50 MG tablet  Commonly known as:  ULTRAM  Take 1-2 tablets (50-100 mg total) by mouth every 6 (six) hours as needed (mild pain).       Follow-up Information    Follow up with AGearlean Alf MD On 02/15/2015.   Specialty:  Orthopedic Surgery   Why:  Call office at [phone] to set up appointment on Tuesday 02/15/2015 with Dr. AWynelle Link   Contact information:   [phone]      [facility]   Why:  home health physical therapy and [facility] has been requested   Contact information:   [id number]      Signed: DArlee Muslim PA-C Orthopaedic Surgery 02/10/2015, 8:28 AM

## 2015-02-01 NOTE — Discharge Instructions (Addendum)
° °Dr. Frank Aluisio °Total Joint Specialist °[facility] °[address] °, [place] [address] °[phone] ° °TOTAL KNEE REPLACEMENT POSTOPERATIVE DIRECTIONS ° °Knee Rehabilitation, Guidelines Following Surgery  °Results after knee surgery are often greatly improved when you follow the exercise, range of motion and muscle strengthening exercises prescribed by your doctor. Safety measures are also important to protect the knee from further injury. Any time any of these exercises cause you to have increased pain or swelling in your knee joint, decrease the amount until you are comfortable again and slowly increase them. If you have problems or questions, call your caregiver or physical therapist for advice.  ° °HOME CARE INSTRUCTIONS  °Remove items at home which could result in a fall. This includes throw rugs or furniture in walking pathways.  °· ICE to the affected knee every three hours for 30 minutes at a time and then as needed for pain and swelling.  Continue to use ice on the knee for pain and swelling from surgery. You may notice swelling that will progress down to the foot and ankle.  This is normal after surgery.  Elevate the leg when you are not up walking on it.   °· Continue to use the breathing machine which will help keep your temperature down.  It is common for your temperature to cycle up and down following surgery, especially at night when you are not up moving around and exerting yourself.  The breathing machine keeps your lungs expanded and your temperature down. °· Do not place pillow under knee, focus on keeping the knee straight while resting ° °DIET °You may resume your previous home diet once your are discharged from the hospital. ° °DRESSING / WOUND CARE / SHOWERING °You may change your dressing 3-5 days after surgery.  Then change the dressing every day with sterile gauze.  Please use good hand washing techniques before changing the dressing.  Do not use any  lotions or creams on the incision until instructed by your surgeon. °You may start showering once you are discharged home but do not submerge the incision under water. Just pat the incision dry and apply a dry gauze dressing on daily. °Change the surgical dressing daily and reapply a dry dressing each time. ° °ACTIVITY °Walk with your walker as instructed. °Use walker as long as suggested by your caregivers. °Avoid periods of inactivity such as sitting longer than an hour when not asleep. This helps prevent blood clots.  °You may resume a sexual relationship in one month or when given the OK by your doctor.  °You may return to work once you are cleared by your doctor.  °Do not drive a car for 6 weeks or until released by you surgeon.  °Do not drive while taking narcotics. ° °WEIGHT BEARING °Weight bearing as tolerated with assist device (walker, cane, etc) as directed, use it as long as suggested by your surgeon or therapist, typically at least 4-6 weeks. ° °POSTOPERATIVE CONSTIPATION PROTOCOL °Constipation - defined medically as fewer than three stools per week and severe constipation as less than one stool per week. ° °One of the most common issues patients have following surgery is constipation.  Even if you have a regular bowel pattern at home, your normal regimen is likely to be disrupted due to multiple reasons following surgery.  Combination of anesthesia, postoperative narcotics, change in appetite and fluid intake all can affect your bowels.  In order to avoid complications following surgery, here are some recommendations   in order to help you during your recovery period. ° °Colace (docusate) - Pick up an over-the-counter form of Colace or another stool softener and take twice a day as long as you are requiring postoperative pain medications.  Take with a full glass of water daily.  If you experience loose stools or diarrhea, hold the colace until you stool forms back up.  If your symptoms do not get better  within 1 week or if they get worse, check with your doctor. ° °Dulcolax (bisacodyl) - Pick up over-the-counter and take as directed by the product packaging as needed to assist with the movement of your bowels.  Take with a full glass of water.  Use this product as needed if not relieved by Colace only.  ° °MiraLax (polyethylene glycol) - Pick up over-the-counter to have on hand.  MiraLax is a solution that will increase the amount of water in your bowels to assist with bowel movements.  Take as directed and can mix with a glass of water, juice, soda, coffee, or tea.  Take if you go more than two days without a movement. °Do not use MiraLax more than once per day. Call your doctor if you are still constipated or irregular after using this medication for 7 days in a row. ° °If you continue to have problems with postoperative constipation, please contact the office for further assistance and recommendations.  If you experience "the worst abdominal pain ever" or develop nausea or vomiting, please contact the office immediatly for further recommendations for treatment. ° °ITCHING ° If you experience itching with your medications, try taking only a single pain pill, or even half a pain pill at a time.  You can also use Benadryl over the counter for itching or also to help with sleep.  ° °TED HOSE STOCKINGS °Wear the elastic stockings on both legs for three weeks following surgery during the day but you may remove then at night for sleeping. ° °MEDICATIONS °See your medication summary on the “After Visit Summary” that the nursing staff will review with you prior to discharge.  You may have some home medications which will be placed on hold until you complete the course of blood thinner medication.  It is important for you to complete the blood thinner medication as prescribed by your surgeon.  Continue your approved medications as instructed at time of discharge. ° °PRECAUTIONS °If you experience chest pain or shortness  of breath - call 911 immediately for transfer to the hospital emergency department.  °If you develop a fever greater that 101 F, purulent drainage from wound, increased redness or drainage from wound, foul odor from the wound/dressing, or calf pain - CONTACT YOUR SURGEON.   °                                                °FOLLOW-UP APPOINTMENTS °Make sure you keep all of your appointments after your operation with your surgeon and caregivers. You should call the office at the above phone number and make an appointment for approximately two weeks after the date of your surgery or on the date instructed by your surgeon outlined in the "After Visit Summary". ° ° °RANGE OF MOTION AND STRENGTHENING EXERCISES  °Rehabilitation of the knee is important following a knee injury or an operation. After just a few days of immobilization, the muscles   of the thigh which control the knee become weakened and shrink (atrophy). Knee exercises are designed to build up the tone and strength of the thigh muscles and to improve knee motion. Often times heat used for twenty to thirty minutes before working out will loosen up your tissues and help with improving the range of motion but do not use heat for the first two weeks following surgery. These exercises can be done on a training (exercise) mat, on the floor, on a table or on a bed. Use what ever works the best and is most comfortable for you Knee exercises include:  °Leg Lifts - While your knee is still immobilized in a splint or cast, you can do straight leg raises. Lift the leg to 60 degrees, hold for 3 sec, and slowly lower the leg. Repeat 10-20 times 2-3 times daily. Perform this exercise against resistance later as your knee gets better.  °Quad and Hamstring Sets - Tighten up the muscle on the front of the thigh (Quad) and hold for 5-10 sec. Repeat this 10-20 times hourly. Hamstring sets are done by pushing the foot backward against an object and holding for 5-10 sec. Repeat as  with quad sets.  °· Leg Slides: Lying on your back, slowly slide your foot toward your buttocks, bending your knee up off the floor (only go as far as is comfortable). Then slowly slide your foot back down until your leg is flat on the floor again. °· Angel Wings: Lying on your back spread your legs to the side as far apart as you can without causing discomfort.  °A rehabilitation program following serious knee injuries can speed recovery and prevent re-injury in the future due to weakened muscles. Contact your doctor or a physical therapist for more information on knee rehabilitation.  ° °IF YOU ARE TRANSFERRED TO A SKILLED REHAB FACILITY °If the patient is transferred to a skilled rehab facility following release from the hospital, a list of the current medications will be sent to the facility for the patient to continue.  When discharged from the skilled rehab facility, please have the facility set up the patient's Home Health Physical Therapy prior to being released. Also, the skilled facility will be responsible for providing the patient with their medications at time of release from the facility to include their pain medication, the muscle relaxants, and their blood thinner medication. If the patient is still at the rehab facility at time of the two week follow up appointment, the skilled rehab facility will also need to assist the patient in arranging follow up appointment in our office and any transportation needs. ° °MAKE SURE YOU:  °Understand these instructions.  °Get help right away if you are not doing well or get worse.  ° ° °Pick up stool softner and laxative for home use following surgery while on pain medications. °Do not submerge incision under water. °Please use good hand washing techniques while changing dressing each day. °May shower starting three days after surgery. °Please use a clean towel to pat the incision dry following showers. °Continue to use ice for pain and swelling after  surgery. °Do not use any lotions or creams on the incision until instructed by your surgeon. ° ° °Information on my medicine - XARELTO® (Rivaroxaban) ° °This medication education was reviewed with me or my healthcare representative as part of my discharge preparation.  °Why was Xarelto® prescribed for you? °Xarelto® was prescribed for you to reduce the risk of blood clots forming after   orthopedic surgery. The medical term for these abnormal blood clots is venous thromboembolism (VTE). ° °What do you need to know about xarelto® ? °Take your Xarelto® ONCE DAILY at the same time every day. °You may take it either with or without food. ° °If you have difficulty swallowing the tablet whole, you may crush it and mix in applesauce just prior to taking your dose. ° °Take Xarelto® exactly as prescribed by your doctor and DO NOT stop taking Xarelto® without talking to the doctor who prescribed the medication.  Stopping without other VTE prevention medication to take the place of Xarelto® may increase your risk of developing a clot. ° °After discharge, you should have regular check-up appointments with your healthcare provider that is prescribing your Xarelto®.   ° °What do you do if you miss a dose? °If you miss a dose, take it as soon as you remember on the same day then continue your regularly scheduled once daily regimen the next day. Do not take two doses of Xarelto® on the same day.  ° °Important Safety Information °A possible side effect of Xarelto® is bleeding. You should call your healthcare provider right away if you experience any of the following: °? Bleeding from an injury or your nose that does not stop. °? Unusual colored urine (red or dark brown) or unusual colored stools (red or black). °? Unusual bruising for unknown reasons. °? A serious fall or if you hit your head (even if there is no bleeding). ° °Some medicines may interact with Xarelto® and might increase your risk of bleeding while on Xarelto®. To help  avoid this, consult your healthcare provider or pharmacist prior to using any new prescription or non-prescription medications, including herbals, vitamins, non-steroidal anti-inflammatory drugs (NSAIDs) and supplements. ° °This website has more information on Xarelto®: www.xarelto.com. ° ° °

## 2015-02-01 NOTE — Care Management Note (Signed)
    Page 1 of 1   02/01/2015     12:41:24 PM CARE MANAGEMENT NOTE 02/01/2015  Patient:  Ryan Meza,Ryan Meza   Account Number:  [id number]  Date Initiated:  02/01/2015  Documentation initiated by:  JEFFRIES,SARAH  Subjective/Objective Assessment:   adm: RIGHT TOTAL KNEE ARTHROPLASTY (Right)     Action/Plan:   discharge planning   Anticipated DC Date:  02/02/2015   Anticipated DC Plan:  HOME W HOME HEALTH SERVICES      DC Planning Services  CM consult      PAC Choice  HOME HEALTH   Choice offered to / List presented to:  C-1 Patient        HH arranged  HH-2 PT      HH agency  [facility]   Status of service:  Completed, signed off Medicare Important Message given?   (If response is "NO", the following Medicare IM given date fields will be blank) Date Medicare IM given:   Medicare IM given by:   Date Additional Medicare IM given:   Additional Medicare IM given by:    Discharge Disposition:  HOME W HOME HEALTH SERVICES  Per UR Regulation:    If discussed at Long Length of Stay Meetings, dates discussed:    Comments:  02/01/15 11:15 CM met with pt in room to offer choice of home health agency.  Pt chooses [facility] to render HHPT and specifically requested [facility] as his therapist.  No DME needed.  Referral emailed to [facility] rep, Tim.  No other CM needs were communicated.  Sarah Jeffries, BSN, CM [phone].   

## 2015-02-01 NOTE — Evaluation (Signed)
Occupational Therapy Evaluation and Discharge Summary Patient Details Name: Ryan JacquetRaymond D Avellino MRN: [id number] DOB: April 28, 1941 Today's Date: 02/01/2015    History of Present Illness Pt is 74 y/o male s/p R TKA with hx L TKA   Clinical Impression   Pt admitted with the above diagnosis and is currently doing very well with adls. Pt has had previous knee done and is familiar with all the adl techniques and will have his wife with him 24.7.  All education is complete and pt is ready to d/c from OT standpoint.    Follow Up Recommendations  No OT follow up;Supervision - Intermittent    Equipment Recommendations  None recommended by OT    Recommendations for Other Services       Precautions / Restrictions Precautions Precautions: Fall;Knee Precaution Comments: s/p R TKA Required Braces or Orthoses: Knee Immobilizer - Right Knee Immobilizer - Right: Discontinue once straight leg raise with < 10 degree lag Restrictions Weight Bearing Restrictions: No Other Position/Activity Restrictions: WBAT      Mobility Bed Mobility Overal bed mobility: Needs Assistance Bed Mobility: Supine to Sit     Supine to sit: Supervision     General bed mobility comments: pt in chair on arriva.  Transfers Overall transfer level: Needs assistance Equipment used: Rolling walker (2 wheeled) Transfers: Sit to/from Stand Sit to Stand: Supervision         General transfer comment: verbal cues for R LE placement    Balance Overall balance assessment: Needs assistance         Standing balance support: Bilateral upper extremity supported;During functional activity Standing balance-Leahy Scale: Good Standing balance comment: Pt can let go of walker for short spurts to stand at sink and groom but does not take challenges in standing.                            ADL Overall ADL's : Needs assistance/impaired Eating/Feeding: Independent;Sitting   Grooming: Oral care;Wash/dry  face;Wash/dry hands;Supervision/safety;Standing   Upper Body Bathing: Set up;Sitting   Lower Body Bathing: Min guard;Sit to/from stand Lower Body Bathing Details (indicate cue type and reason): min guard when bathing self in standing. Upper Body Dressing : Set up;Sitting   Lower Body Dressing: Minimal assistance;Sit to/from stand Lower Body Dressing Details (indicate cue type and reason): min assist to donn sock and shoe on operated leg Toilet Transfer: Supervision/safety;Comfort height toilet;RW;Grab bars   Toileting- Clothing Manipulation and Hygiene: Supervision/safety;Sit to/from stand   Tub/ Shower Transfer: Walk-in shower;Min guard;Ambulation;Rolling walker   Functional mobility during ADLs: Supervision/safety;Rolling walker General ADL Comments: PT doing great with adls. Pt needs occasional assist with R sock and shoe but otherwise, given extra time, is doing well with other adls.     Vision     Perception     Praxis      Pertinent Vitals/Pain Pain Assessment: 0-10 Pain Score: 3  Pain Location: R knee Pain Descriptors / Indicators: Aching Pain Intervention(s): Monitored during session;Repositioned;Ice applied     Hand Dominance Right   Extremity/Trunk Assessment Upper Extremity Assessment Upper Extremity Assessment: Overall WFL for tasks assessed   Lower Extremity Assessment Lower Extremity Assessment: Defer to PT evaluation   Cervical / Trunk Assessment Cervical / Trunk Assessment: Normal   Communication Communication Communication: No difficulties   Cognition Arousal/Alertness: Awake/alert Behavior During Therapy: WFL for tasks assessed/performed Overall Cognitive Status: Within Functional Limits for tasks assessed  General Comments       Exercises       Shoulder Instructions      Home Living Family/patient expects to be discharged to:: Private residence Living Arrangements: Spouse/significant other Available Help  at Discharge: Family;Available 24 hours/day Type of Home: House Home Access: Stairs to enter [employer] of Steps: 1 Entrance Stairs-Rails: None Home Layout: One level     Bathroom Shower/Tub: Producer, television/film/video: Standard     Home Equipment: Shower seat - built in;Walker - 2 wheels;Cane - single point   Additional Comments: will borrow 3;1 and reacher from friend      Prior Functioning/Environment Level of Independence: Independent             OT Diagnosis:     OT Problem List:     OT Treatment/Interventions:      OT Goals(Current goals can be found in the care plan section) Acute Rehab OT Goals Patient Stated Goal: to go home. OT Goal Formulation: All assessment and education complete, DC therapy  OT Frequency:     Barriers to D/C:            Co-evaluation              End of Session Equipment Utilized During Treatment: Rolling walker Nurse Communication: Mobility status  Activity Tolerance: Patient tolerated treatment well Patient left: in chair;with call bell/phone within reach   Time: 1059-1128 OT Time Calculation (min): 29 min Charges:  OT General Charges $OT Visit: 1 Procedure OT Evaluation $Initial OT Evaluation Tier I: 1 Procedure OT Treatments $Self Care/Home Management : 8-22 mins G-Codes:    Hope Budds 2015/02/04, 11:35 AM  [phone]

## 2015-02-01 NOTE — Progress Notes (Signed)
   Subjective: 1 Day Post-Op Procedure(s) (LRB): RIGHT TOTAL KNEE ARTHROPLASTY (Right) Patient reports pain as mild.   Patient seen in rounds with Dr. Lequita HaltAluisio.  He is doing well this morning. Patient is well, and has had no acute complaints or problems We will start therapy today.  If he does great, then possibly home this afternoon, but more likely tomorrow. Plan is to go Home after hospital stay.  Objective: Vital signs in last 24 hours: Temp:  [97.3 F (36.3 C)-98.3 F (36.8 C)] 97.8 F (36.6 C) (04/05 0504) Pulse Rate:  [45-69] 63 (04/05 0504) Resp:  [12-16] 16 (04/05 0504) BP: (125-165)/(65-86) 140/84 mmHg (04/05 0504) SpO2:  [95 %-100 %] 96 % (04/05 0504) Weight:  [80.74 kg (178 lb)] 80.74 kg (178 lb) (04/04 0945)  Intake/Output from previous day:  Intake/Output Summary (Last 24 hours) at 02/01/15 0828 Last data filed at 02/01/15 0811  Gross per 24 hour  Intake 3151.67 ml  Output   3910 ml  Net -758.33 ml    Intake/Output this shift: Total I/O In: 100 [P.O.:100] Out: 500 [Urine:500]  Labs:  Recent Labs  02/01/15 0455  HGB 11.4*    Recent Labs  02/01/15 0455  WBC 12.4*  RBC 4.14*  HCT 35.9*  PLT 194    Recent Labs  02/01/15 0455  NA 138  K 4.1  CL 106  CO2 25  BUN 14  CREATININE 0.91  GLUCOSE 146*  CALCIUM 8.7   No results for input(s): LABPT, INR in the last 72 hours.  EXAM General - Patient is Alert, Appropriate and Oriented Extremity - Neurovascular intact Sensation intact distally Dorsiflexion/Plantar flexion intact Dressing - dressing C/D/I Motor Function - intact, moving foot and toes well on exam.  Hemovac pulled without difficulty.  Past Medical History  Diagnosis Date  . Hyperlipidemia     under control  . GERD (gastroesophageal reflux disease)   . Wears glasses   . Full dentures   . Arthritis   . Double vision     occasional  . Glaucoma     right eye  . Hypertension     under control  . History of blood  transfusion 2013    2 unit, no reaction    Assessment/Plan: 1 Day Post-Op Procedure(s) (LRB): RIGHT TOTAL KNEE ARTHROPLASTY (Right) Principal Problem:   OA (osteoarthritis) of knee  Estimated body mass index is 24.84 kg/(m^2) as calculated from the following:   Height as of this encounter: 5\' 11"  (1.803 m).   Weight as of this encounter: 80.74 kg (178 lb). Advance diet Up with therapy Discharge home with home health  DVT Prophylaxis - Xarelto Weight-Bearing as tolerated to right leg D/C O2 and Pulse OX and try on Room Air  Diet - Cardiac diet Follow up - in 2 weeks Activity - WBAT Disposition - Plan is for home. D/C Meds - See DC Summary  Avel Peacerew Mya Suell, PA-C Orthopaedic Surgery 02/01/2015, 8:28 AM

## 2015-02-01 NOTE — Progress Notes (Signed)
Physical Therapy Treatment Patient Details Name: Ryan Meza MRN: [id number] DOB: [birth year] Today's Date: 02/01/2015    History of Present Illness Pt is 74 y/o male s/p R TKA with hx L TKA    PT Comments    Pt progressing very well.  Plans to ambulate again and practice steps with spouse this afternoon.  Follow Up Recommendations  Home health PT     Equipment Recommendations  None recommended by PT    Recommendations for Other Services       Precautions / Restrictions Precautions Precautions: Fall;Knee Required Braces or Orthoses: Knee Immobilizer - Right Knee Immobilizer - Right: Discontinue once straight leg raise with < 10 degree lag Restrictions Other Position/Activity Restrictions: WBAT    Mobility  Bed Mobility Overal bed mobility: Needs Assistance Bed Mobility: Supine to Sit     Supine to sit: Supervision     General bed mobility comments: pt required increased time  Transfers Overall transfer level: Needs assistance Equipment used: Rolling walker (2 wheeled) Transfers: Sit to/from Stand Sit to Stand: Supervision         General transfer comment: verbal cues for R LE placement  Ambulation/Gait Ambulation/Gait assistance: Supervision Ambulation Distance (Feet): 200 Feet Assistive device: Rolling walker (2 wheeled) Gait Pattern/deviations: Step-through pattern;Decreased stride length;Antalgic     General Gait Details: short steps however good use of RW   Stairs Stairs: Yes Stairs assistance: Min guard Stair Management: Step to pattern;Backwards;With walker Number of Stairs: 2 General stair comments: verbal cues for sequence, RW placement, safety, performed twice  Wheelchair Mobility    Modified Rankin (Stroke Patients Only)       Balance                                    Cognition Arousal/Alertness: Awake/alert Behavior During Therapy: WFL for tasks assessed/performed Overall Cognitive Status: Within  Functional Limits for tasks assessed                      Exercises Total Joint Exercises Ankle Circles/Pumps: AROM;Both;10 reps Quad Sets: AROM;Both;10 reps Short Arc Quad: 10 reps;AROM;Right Heel Slides: AAROM;Seated;Right;10 reps Hip ABduction/ADduction: AROM;Right;15 reps Straight Leg Raises: AROM;Right;10 reps;AAROM Goniometric ROM: knee flexion approx 80* sitting in chair    General Comments        Pertinent Vitals/Pain Pain Assessment: 0-10 Pain Score: 3  Pain Location: R knee Pain Descriptors / Indicators: Aching;Sore Pain Intervention(s): Limited activity within patient's tolerance;Monitored during session;Premedicated before session;Repositioned;Ice applied    Home Living                      Prior Function            PT Goals (current goals can now be found in the care plan section) Progress towards PT goals: Progressing toward goals    Frequency  7X/week    PT Plan Current plan remains appropriate    Co-evaluation             End of Session Equipment Utilized During Treatment: Gait belt;Right knee immobilizer Activity Tolerance: Patient tolerated treatment well Patient left: in chair;with call bell/phone within reach     Time: 1324-4010 PT Time Calculation (min) (ACUTE ONLY): 32 min  Charges:  $Gait Training: 8-22 mins $Therapeutic Exercise: 8-22 mins  G Codes:      Kingdom Vanzanten,Ryan Meza 02/01/2015, 9:18 AM Zenovia JarredKati Lynsie Meza, PT, DPT 02/01/2015 Pager: [phone]

## 2015-02-01 NOTE — Progress Notes (Signed)
Physical Therapy Treatment Note   02/01/15 1400  PT Visit Information  Last PT Received On 02/01/15  Assistance Needed +1  History of Present Illness Pt is 74 y/o male s/p R TKA with hx L TKA  PT Time Calculation  PT Start Time (ACUTE ONLY) 1324  PT Stop Time (ACUTE ONLY) 1338  PT Time Calculation (min) (ACUTE ONLY) 14 min  Subjective Data  Subjective Pt tolerated treatment well. Pt able to complete transfers and ambulation with min guard. Pt practiced staris with spouse assisting with RW and they feel comfortable with doing stairs at home. Pt is progressing well with PT and feels comfortable with discharge home today.   Patient Stated Goal to go home.  Precautions  Precautions Fall;Knee  Precaution Comments s/p R TKA  Required Braces or Orthoses Knee Immobilizer - Right  Knee Immobilizer - Right Discontinue once straight leg raise with < 10 degree lag  Restrictions  Weight Bearing Restrictions No  Other Position/Activity Restrictions WBAT  Pain Assessment  Pain Assessment 0-10  Pain Score (pt did not give a number for pain, )  Pain Location R knee  Pain Descriptors / Indicators Aching  Pain Intervention(s) Limited activity within patient's tolerance;Monitored during session  Cognition  Arousal/Alertness Awake/alert  Behavior During Therapy WFL for tasks assessed/performed  Overall Cognitive Status Within Functional Limits for tasks assessed  Bed Mobility  Overal bed mobility (pt in chair)  Transfers  Overall transfer level Needs assistance  Equipment used Rolling walker (2 wheeled)  Transfers Sit to/from Stand  Sit to Stand Supervision  General transfer comment supervision for safety  Ambulation/Gait  Ambulation/Gait assistance Supervision  Ambulation Distance (Feet) 160 Feet  Assistive device Rolling walker (2 wheeled)  Gait Pattern/deviations Antalgic;Step-through pattern  General Gait Details no cues needed  Stairs Yes  Stairs assistance Min guard  Stair Management  Backwards;Step to pattern;With walker  Number of Stairs 2  General stair comments verbal cues given for safe stair technique, and spouse was educated on how to asisst pt when doing stairs.  PT - End of Session  Equipment Utilized During Treatment Gait belt;Right knee immobilizer  Activity Tolerance Patient tolerated treatment well  Patient left in chair;with call bell/phone within reach;with family/visitor present  Nurse Communication Mobility status  PT - Assessment/Plan  PT Plan Current plan remains appropriate  PT Frequency (ACUTE ONLY) 7X/week  Follow Up Recommendations Home health PT  PT equipment None recommended by PT  PT Goal Progression  Progress towards PT goals Progressing toward goals  Acute Rehab PT Goals  PT Goal Formulation With patient  Time For Goal Achievement 02/14/15  Potential to Achieve Goals Good  PT General Charges  $$ ACUTE PT VISIT 1 Procedure  PT Treatments  $Gait Training 8-22 mins  Netty StarringJames Yamile Roedl, SPT

## 2015-02-02 NOTE — Progress Notes (Signed)
Discharge summary sent to payer through [facility]  

## 2015-02-21 ENCOUNTER — Ambulatory Visit: Payer: Medicare Other | Attending: Orthopedic Surgery | Admitting: Physical Therapy

## 2015-02-21 ENCOUNTER — Encounter: Payer: Self-pay | Admitting: Physical Therapy

## 2015-02-21 DIAGNOSIS — Z96653 Presence of artificial knee joint, bilateral: Secondary | ICD-10-CM | POA: Diagnosis not present

## 2015-02-21 DIAGNOSIS — M25661 Stiffness of right knee, not elsewhere classified: Secondary | ICD-10-CM | POA: Insufficient documentation

## 2015-02-21 DIAGNOSIS — M25561 Pain in right knee: Secondary | ICD-10-CM | POA: Diagnosis present

## 2015-02-21 NOTE — Therapy (Signed)
[facility] [address] [place], [place], [address] Phone: [phone]   Fax:  [phone]  Physical Therapy Evaluation  Patient Details  Name: Ryan Meza MRN: [id number] Date of Birth: [birth year] Referring Provider:  Ollen GrossAluisio, Frank, MD  Encounter Date: 02/21/2015      PT End of Session - 02/21/15 1328    Visit Number 1   Date for PT Re-Evaluation 04/23/15   PT Start Time 1304   PT Stop Time 1350   PT Time Calculation (min) 46 min      Past Medical History  Diagnosis Date  . Hyperlipidemia     under control  . GERD (gastroesophageal reflux disease)   . Wears glasses   . Full dentures   . Arthritis   . Double vision     occasional  . Glaucoma     right eye  . Hypertension     under control  . History of blood transfusion 2013    2 unit, no reaction    Past Surgical History  Procedure Laterality Date  . Prostate biopsy  3-4 yrs ago  . Prostate biopsy    . Cystoscopy  08/26/2012    Procedure: CYSTOSCOPY;  Surgeon: Antony HasteMatthew Ramsey Eskridge, MD;  Location: [facility];  Service: Urology;  Laterality: N/A;  . Prostatectomy  10/03/2012    Procedure: PROSTATECTOMY;  Surgeon: Antony HasteMatthew Ramsey Eskridge, MD;  Location: [facility];  Service: Urology;  Laterality: N/A;  Open Simple Prostatectomy  . Colonoscopy  2006  . Inguinal hernia repair Right 06/04/2014    Procedure: OPEN REPAIR RIGHT INGUINAL HERNIA REPAIR;  Surgeon: Ernestene MentionHaywood M Ingram, MD;  Location: [facility];  Service: General;  Laterality: Right;  . Insertion of mesh N/A 06/04/2014    Procedure: INSERTION OF MESH;  Surgeon: Ernestene MentionHaywood M Ingram, MD;  Location: [facility];  Service: General;  Laterality: N/A;  . Eye surgery  2009    right cataract  . Eye surgery  10/2008    retina-right  . Total knee arthroplasty Left 08/30/2014    Procedure: Left TOTAL KNEE ARTHROPLASTY;  Surgeon: Loanne DrillingFrank Aluisio V, MD;  Location: [facility];  Service:  Orthopedics;  Laterality: Left;  . Total knee arthroplasty Right 01/31/2015    Procedure: RIGHT TOTAL KNEE ARTHROPLASTY;  Surgeon: Ollen GrossFrank Aluisio, MD;  Location: [facility];  Service: Orthopedics;  Laterality: Right;    There were no vitals filed for this visit.  Visit Diagnosis:  Right knee pain - Plan: PT plan of care cert/re-cert  Knee stiffness, right - Plan: PT plan of care cert/re-cert      Subjective Assessment - 02/21/15 1308    Subjective Patient underwent a right TKR on 01/31/15.  Had home PT until last week.  Reports no issues, "just slower than I think it should be"  Had the left TKR in November.   Pertinent History none per patient   Limitations Lifting;Standing;Walking;House hold activities   How long can you sit comfortably? 15   How long can you stand comfortably? 10   How long can you walk comfortably? 10   Patient Stated Goals walk without cane for community distances   Currently in Pain? Yes   Pain Score 3    Pain Location Knee   Pain Orientation Right   Pain Descriptors / Indicators Aching;Tightness;Sore   Pain Type Surgical pain   Pain Onset 1 to 4 weeks ago   Pain Frequency Intermittent   Aggravating  Factors  reports sitting long periods will increase pain as will bending it   Pain Relieving Factors rest and pain meds, reports he can have no pain at times but always "sore"   Effect of Pain on Daily Activities limits all mobility            [facility] PT Assessment - 02/21/15 0001    Assessment   Medical Diagnosis S/P right TKR   Onset Date 01/31/15   Prior Therapy home PT   Precautions   Precautions None   Restrictions   Weight Bearing Restrictions No   Balance Screen   Has the patient fallen in the past 6 months No   Has the patient had a decrease in activity level because of a fear of falling?  No   Is the patient reluctant to leave their home because of a fear of falling?  No   Home Environment   Additional Comments a few steps into home, does his own  housework and yardwork   Prior Function   Leisure plays golf 1x/week   AROM   Overall AROM Comments AROM of the right knee at edge of bed 20-91 degrees flexion   PROM   Overall PROM Comments PROM 15-95 degrees flexion   Strength   Overall Strength Comments 4-/5 with pain   Palpation   Palpation scar is healed, tender medial and lateral joint line, scar is a little stiff distally   Ambulation/Gait   Gait Comments gait is with SPC, slow, stiff on the right, minimal bend, antalgic on the right                   [facility] Adult PT Treatment/Exercise - 02/21/15 0001    Knee/Hip Exercises: Aerobic   Tread Mill Nustep Level 4 x 6 minutes   Knee/Hip Exercises: Machines for Strengthening   Cybex Knee Extension 5# with some stretch into flexion b/n sets   Cybex Knee Flexion 25# some stretch into extension b/n sets                PT Education - 02/21/15 1327    Education provided Yes   Education Details went over low load long duration stretches for flexion and extension   Person(s) Educated Patient   Methods Explanation;Demonstration;Handout   Comprehension Verbalized understanding          PT Short Term Goals - 02/21/15 1330    PT SHORT TERM GOAL #1   Title independent with initial HEP   Time 2   Period Weeks   Status New           PT Long Term Goals - 02/21/15 1330    PT LONG TERM GOAL #1   Title independent with RICE   Time 8   Period Weeks   Status New   PT LONG TERM GOAL #2   Title increase AROM of the right knee to 8-115 degrees flexion   Time 8   Period Weeks   Status New   PT LONG TERM GOAL #3   Title decrease pain with sitting by 50%   Time 8   Period Weeks   Status New   PT LONG TERM GOAL #4   Title walk community distances without cane   Time 8   Period Weeks   Status New               Plan - 02/21/15 1328    Clinical Impression Statement Patient iwth right TKR on 01/31/15, he  has decreased ROM of the right knee  20-91  degrees flexion, some difficulty walking   Pt will benefit from skilled therapeutic intervention in order to improve on the following deficits Abnormal gait;Decreased activity tolerance;Decreased range of motion;Decreased mobility;Increased edema;Increased muscle spasms;Impaired flexibility;Pain   Rehab Potential Good   PT Frequency 3x / week   PT Duration 4 weeks   PT Treatment/Interventions ADLs/Self Care Home Management;Cryotherapy;Electrical Stimulation;Functional mobility training;Gait training;Stair training;Therapeutic exercise;Balance training;Manual techniques;Patient/family education   PT Next Visit Plan add manual PT and gym exercises   Consulted and Agree with Plan of Care Patient          G-Codes - 19-Mar-2015 1350    Functional Assessment Tool Used FOTO   Functional Limitation Mobility: Walking and moving around   Mobility: Walking and Moving Around Current Status [phone]) At least 40 percent but less than 60 percent impaired, limited or restricted   Mobility: Walking and Moving Around Goal Status (U0454) At least 40 percent but less than 60 percent impaired, limited or restricted       Problem List Patient Active Problem List   Diagnosis Date Noted  . OA (osteoarthritis) of knee 08/30/2014  . Right inguinal hernia 05/07/2014    Jearld Lesch, PT 19-Mar-2015, 2:00 PM  [facility] [address] [place], [place], [address] Phone: [phone]   Fax:  [phone]

## 2015-02-25 ENCOUNTER — Encounter: Payer: Self-pay | Admitting: Gastroenterology

## 2015-02-25 ENCOUNTER — Encounter: Payer: Self-pay | Admitting: Physical Therapy

## 2015-02-25 ENCOUNTER — Ambulatory Visit: Payer: Medicare Other | Admitting: Physical Therapy

## 2015-02-25 DIAGNOSIS — M25561 Pain in right knee: Secondary | ICD-10-CM | POA: Diagnosis not present

## 2015-02-25 DIAGNOSIS — M25661 Stiffness of right knee, not elsewhere classified: Secondary | ICD-10-CM

## 2015-02-25 NOTE — Therapy (Signed)
[facility] [address] [place], [place], [address] Phone: [phone]   Fax:  [phone]  Physical Therapy Treatment  Patient Details  Name: Ryan Meza MRN: [id number] Date of Birth: 09-Mar-1941 Referring Provider:  Ollen Gross, MD  Encounter Date: 02/25/2015      PT End of Session - 02/25/15 0842    Visit Number 2   Date for PT Re-Evaluation 04/23/15   PT Start Time 0756   PT Stop Time 0842   PT Time Calculation (min) 46 min      Past Medical History  Diagnosis Date  . Hyperlipidemia     under control  . GERD (gastroesophageal reflux disease)   . Wears glasses   . Full dentures   . Arthritis   . Double vision     occasional  . Glaucoma     right eye  . Hypertension     under control  . History of blood transfusion 2013    2 unit, no reaction    Past Surgical History  Procedure Laterality Date  . Prostate biopsy  3-4 yrs ago  . Prostate biopsy    . Cystoscopy  08/26/2012    Procedure: CYSTOSCOPY;  Surgeon: [name], MD;  [facility];  Service: Urology;  Laterality: N/A;  . Prostatectomy  10/03/2012    Procedure: PROSTATECTOMY;  Surgeon: [name], MD;  [facility];  Service: Urology;  Laterality: N/A;  Open Simple Prostatectomy  . Colonoscopy  2006  . Inguinal hernia repair Right 06/04/2014    Procedure: OPEN REPAIR RIGHT INGUINAL HERNIA REPAIR;  Surgeon: Ernestene Mention, MD;  Location: [facility];  Service: General;  Laterality: Right;  . Insertion of mesh N/A 06/04/2014    Procedure: INSERTION OF MESH;  Surgeon: Ernestene Mention, MD;  Location: [facility];  Service: General;  Laterality: N/A;  . Eye surgery  2009    right cataract  . Eye surgery  10/2008    retina-right  . Total knee arthroplasty Left 08/30/2014    Procedure: Left TOTAL KNEE ARTHROPLASTY;  Surgeon: Loanne Drilling, MD;  [facility];  Service:  Orthopedics;  Laterality: Left;  . Total knee arthroplasty Right 01/31/2015    Procedure: RIGHT TOTAL KNEE ARTHROPLASTY;  Surgeon: Ollen Gross, MD;  [facility];  Service: Orthopedics;  Laterality: Right;    There were no vitals filed for this visit.  Visit Diagnosis:  Right knee pain  Knee stiffness, right      Subjective Assessment - 02/25/15 0756    Subjective I am always stiff in the morning, the exercises are pretty tight on the knee.  Always stiff after sitting   Currently in Pain? Yes   Pain Score 3    Pain Location Knee   Pain Orientation Right   Pain Descriptors / Indicators Sore;Tightness   Pain Type Surgical pain                         [facility] Adult PT Treatment/Exercise - 02/25/15 0001    High Level Balance   High Level Balance Activities Side stepping;Backward walking;Tandem walking   High Level Balance Comments practice gait, good bending and cues for less side lean, also did stairs step over step   Knee/Hip Exercises: Aerobic   Tread Mill Nustep Level 4 x 6 minutes   Knee/Hip Exercises: Machines for Strengthening   Cybex Knee  Extension 5# with some stretch into flexion b/n sets   Cybex Knee Flexion 25# some stretch into extension b/n sets   Cybex Leg Press 20# and no weight to get better flexion   Manual Therapy   Manual Therapy Myofascial release;Passive ROM   Myofascial Release scar and patella   Passive ROM flexion and extension                  PT Short Term Goals - 02/21/15 1330    PT SHORT TERM GOAL #1   Title independent with initial HEP   Time 2   Period Weeks   Status New           PT Long Term Goals - 02/21/15 1330    PT LONG TERM GOAL #1   Title independent with RICE   Time 8   Period Weeks   Status New   PT LONG TERM GOAL #2   Title increase AROM of the right knee to 8-115 degrees flexion   Time 8   Period Weeks   Status New   PT LONG TERM GOAL #3   Title decrease pain with sitting by 50%   Time  8   Period Weeks   Status New   PT LONG TERM GOAL #4   Title walk community distances without cane   Time 8   Period Weeks   Status New               Plan - 02/25/15 16100842    Clinical Impression Statement Doing very well.  Very stiff in the mornign or after any sitting   PT Next Visit Plan work on better gait   Consulted and Agree with Plan of Care Patient        Problem List Patient Active Problem List   Diagnosis Date Noted  . OA (osteoarthritis) of knee 08/30/2014  . Right inguinal hernia 05/07/2014    Jearld LeschALBRIGHT,MICHAEL W, PT 02/25/2015, 8:43 AM  [facility] [address] [place], [place], [address] Phone: [phone]   Fax:  [phone]

## 2015-02-28 ENCOUNTER — Encounter: Payer: Self-pay | Admitting: Physical Therapy

## 2015-02-28 ENCOUNTER — Ambulatory Visit: Payer: Medicare Other | Attending: Orthopedic Surgery | Admitting: Physical Therapy

## 2015-02-28 DIAGNOSIS — M25561 Pain in right knee: Secondary | ICD-10-CM | POA: Diagnosis present

## 2015-02-28 DIAGNOSIS — Z96653 Presence of artificial knee joint, bilateral: Secondary | ICD-10-CM | POA: Diagnosis not present

## 2015-02-28 DIAGNOSIS — M25661 Stiffness of right knee, not elsewhere classified: Secondary | ICD-10-CM

## 2015-02-28 NOTE — Therapy (Signed)
[facility] [address] [place], [place], [address] Phone: [phone]   Fax:  [phone]  Physical Therapy Treatment  Patient Details  Name: Ryan Meza MRN: [id number] Date of Birth: [birth year] Referring Provider:  Ollen GrossAluisio, Frank, MD  Encounter Date: 02/28/2015      PT End of Session - 02/28/15 1638    Visit Number 3   Date for PT Re-Evaluation 04/23/15   PT Start Time 1600   PT Stop Time 1652   PT Time Calculation (min) 52 min      Past Medical History  Diagnosis Date  . Hyperlipidemia     under control  . GERD (gastroesophageal reflux disease)   . Wears glasses   . Full dentures   . Arthritis   . Double vision     occasional  . Glaucoma     right eye  . Hypertension     under control  . History of blood transfusion 2013    2 unit, no reaction    Past Surgical History  Procedure Laterality Date  . Prostate biopsy  3-4 yrs ago  . Prostate biopsy    . Cystoscopy  08/26/2012    Procedure: CYSTOSCOPY;  Surgeon: Antony HasteMatthew Ramsey Eskridge, MD;  [facility];  Service: Urology;  Laterality: N/A;  . Prostatectomy  10/03/2012    Procedure: PROSTATECTOMY;  Surgeon: Antony HasteMatthew Ramsey Eskridge, MD;  Location[facility];  Service: Urology;  Laterality: N/A;  Open Simple Prostatectomy  . Colonoscopy  2006  . Inguinal hernia repair Right 06/04/2014    Procedure: OPEN REPAIR RIGHT INGUINAL HERNIA REPAIR;  Surgeon: Ernestene MentionHaywood M Ingram, MD;  Location: [facility];  Service: General;  Laterality: Right;  . Insertion of mesh N/A 06/04/2014    Procedure: INSERTION OF MESH;  Surgeon: Ernestene MentionHaywood M Ingram, MD;  [facility];  Service: General;  Laterality: N/A;  . Eye surgery  2009    right cataract  . Eye surgery  10/2008    retina-right  . Total knee arthroplasty Left 08/30/2014    Procedure: Left TOTAL KNEE ARTHROPLASTY;  Surgeon: Loanne DrillingFrank Aluisio V, MD;  [facility];  Service:  Orthopedics;  Laterality: Left;  . Total knee arthroplasty Right 01/31/2015    Procedure: RIGHT TOTAL KNEE ARTHROPLASTY;  Surgeon: Ollen GrossFrank Aluisio, MD;  [facility];  Service: Orthopedics;  Laterality: Right;    There were no vitals filed for this visit.  Visit Diagnosis:  Right knee pain  Knee stiffness, right      Subjective Assessment - 02/28/15 1621    Subjective I mowed the yard with a push mower and I probably over did it, a little sore and stiff   Currently in Pain? Yes   Pain Score 4    Pain Location Knee   Pain Orientation Right   Pain Descriptors / Indicators Sore;Tightness   Pain Type Surgical pain                         [facility] Adult PT Treatment/Exercise - 02/28/15 0001    Ambulation/Gait   Gait Comments gait without device, around the building 800 feet, worked on toe clears stepping over obstacles with the right leg being lead and following, also did stairs sep over step.   Knee/Hip Exercises: Aerobic   Tread Mill Nustep Level 4 x 6 minutes   Knee/Hip Exercises: Machines for Strengthening   Cybex Knee Extension 10#  Cybex Knee Flexion 35# some stretch into extension b/n sets   Cybex Leg Press 30# and then without weight for increase of flexion   Manual Therapy   Manual Therapy Myofascial release;Passive ROM   Myofascial Release scar and patella   Passive ROM flexion and extension                  PT Short Term Goals - 02/21/15 1330    PT SHORT TERM GOAL #1   Title independent with initial HEP   Time 2   Period Weeks   Status New           PT Long Term Goals - 02/21/15 1330    PT LONG TERM GOAL #1   Title independent with RICE   Time 8   Period Weeks   Status New   PT LONG TERM GOAL #2   Title increase AROM of the right knee to 8-115 degrees flexion   Time 8   Period Weeks   Status New   PT LONG TERM GOAL #3   Title decrease pain with sitting by 50%   Time 8   Period Weeks   Status New   PT LONG TERM GOAL #4    Title walk community distances without cane   Time 8   Period Weeks   Status New               Plan - 02/28/15 1640    Clinical Impression Statement Still stiff and sore iwth any sitting, as well as him being up and mowing the lawn this weekend.   PT Next Visit Plan again, we will focus some on gait to get a more natural bend in the knee   Consulted and Agree with Plan of Care Patient        Problem List Patient Active Problem List   Diagnosis Date Noted  . OA (osteoarthritis) of knee 08/30/2014  . Right inguinal hernia 05/07/2014    Jearld Lesch, PT 02/28/2015, 4:52 PM  [facility] [facility] [address] [place], [place], [address] Phone: [phone]   Fax:  [phone]

## 2015-03-04 ENCOUNTER — Ambulatory Visit: Payer: Medicare Other | Admitting: Physical Therapy

## 2015-03-04 DIAGNOSIS — M25561 Pain in right knee: Secondary | ICD-10-CM | POA: Diagnosis not present

## 2015-03-04 DIAGNOSIS — M25661 Stiffness of right knee, not elsewhere classified: Secondary | ICD-10-CM

## 2015-03-04 NOTE — Therapy (Signed)
[facility] [address] [place], [place], [address] Phone: [phone]   Fax:  [phone]  Physical Therapy Treatment  Patient Details  Name: Ryan JacquetRaymond D Meza MRN: [id number] Date of Birth: January 13, 1941 Referring Provider:  Ollen GrossAluisio, Frank, MD  Encounter Date: 03/04/2015      PT End of Session - 03/04/15 1136    Visit Number 4   PT Start Time 1100   PT Stop Time 1145   PT Time Calculation (min) 45 min   Activity Tolerance Patient tolerated treatment well   Behavior During Therapy [facility] for tasks assessed/performed      Past Medical History  Diagnosis Date   Hyperlipidemia     under control   GERD (gastroesophageal reflux disease)    Wears glasses    Full dentures    Arthritis    Double vision     occasional   Glaucoma     right eye   Hypertension     under control   History of blood transfusion 2013    2 unit, no reaction    Past Surgical History  Procedure Laterality Date   Prostate biopsy  3-4 yrs ago   Prostate biopsy     Cystoscopy  08/26/2012    Procedure: CYSTOSCOPY;  Surgeon: Antony HasteMatthew Ramsey Eskridge, MD;  Location: [facility];  Service: Urology;  Laterality: N/A;   Prostatectomy  10/03/2012    Procedure: PROSTATECTOMY;  Surgeon: Antony HasteMatthew Ramsey Eskridge, MD;  Location: [facility];  Service: Urology;  Laterality: N/A;  Open Simple Prostatectomy   Colonoscopy  2006   Inguinal hernia repair Right 06/04/2014    Procedure: OPEN REPAIR RIGHT INGUINAL HERNIA REPAIR;  Surgeon: Ernestene MentionHaywood M Ingram, MD;  Location: [facility];  Service: General;  Laterality: Right;   Insertion of mesh N/A 06/04/2014    Procedure: INSERTION OF MESH;  Surgeon: Ernestene MentionHaywood M Ingram, MD;  Location:  SURGERY CENTER;  Service: General;  Laterality: N/A;   Eye surgery  2009    right cataract   Eye surgery  10/2008    retina-right   Total knee arthroplasty Left 08/30/2014    Procedure: Left TOTAL KNEE  ARTHROPLASTY;  Surgeon: Loanne DrillingFrank Aluisio V, MD;  Location: [facility];  Service: Orthopedics;  Laterality: Left;   Total knee arthroplasty Right 01/31/2015    Procedure: RIGHT TOTAL KNEE ARTHROPLASTY;  Surgeon: Ollen GrossFrank Aluisio, MD;  Location: [facility];  Service: Orthopedics;  Laterality: Right;    There were no vitals filed for this visit.  Visit Diagnosis:  Knee stiffness, right  Right knee pain                       [facility] Adult PT Treatment/Exercise - 03/04/15 0001    Exercises   Exercises Knee/Hip   Knee/Hip Exercises: Aerobic   Tread Mill Nustep Level 4 x 6 minutes   Knee/Hip Exercises: Machines for Strengthening   Cybex Knee Extension 15#   Cybex Knee Flexion 35# some stretch into extension b/n sets   Cybex Leg Press 30# and then without weight for increase of flexion   Knee/Hip Exercises: Standing   Forward Step Up 2 sets;10 reps;Right  4 inch   Manual Therapy   Manual Therapy Myofascial release;Passive ROM   Myofascial Release scar and patella   Passive ROM flexion and extension                  PT Short Term Goals -  02/21/15 1330    PT SHORT TERM GOAL #1   Title independent with initial HEP   Time 2   Period Weeks   Status New           PT Long Term Goals - 02/21/15 1330    PT LONG TERM GOAL #1   Title independent with RICE   Time 8   Period Weeks   Status New   PT LONG TERM GOAL #2   Title increase AROM of the right knee to 8-115 degrees flexion   Time 8   Period Weeks   Status New   PT LONG TERM GOAL #3   Title decrease pain with sitting by 50%   Time 8   Period Weeks   Status New   PT LONG TERM GOAL #4   Title walk community distances without cane   Time 8   Period Weeks   Status New               Plan - 03/04/15 1140    Clinical Impression Statement Excellent flexion ROM.  Conts to lack ~10* extension.     Pt will benefit from skilled therapeutic intervention in order to improve on the following deficits Abnormal  gait;Decreased activity tolerance;Decreased range of motion;Decreased mobility;Increased edema;Increased muscle spasms;Impaired flexibility;Pain   Rehab Potential Good   PT Frequency 3x / week   PT Duration 4 weeks   PT Treatment/Interventions ADLs/Self Care Home Management;Cryotherapy;Electrical Stimulation;Functional mobility training;Gait training;Stair training;Therapeutic exercise;Balance training;Manual techniques;Patient/family education   PT Next Visit Plan Address extension        Problem List Patient Active Problem List   Diagnosis Date Noted   OA (osteoarthritis) of knee 08/30/2014   Right inguinal hernia 05/07/2014    Tomie ChinaLarry C Clements, PTA 03/04/2015, 11:45 AM  [facility] [address] [place], [place], [address] Phone: [phone]   Fax:  [phone]

## 2015-03-08 ENCOUNTER — Ambulatory Visit: Payer: Medicare Other | Admitting: Physical Therapy

## 2015-03-08 ENCOUNTER — Encounter: Payer: Self-pay | Admitting: Physical Therapy

## 2015-03-08 DIAGNOSIS — M25561 Pain in right knee: Secondary | ICD-10-CM

## 2015-03-08 DIAGNOSIS — M25661 Stiffness of right knee, not elsewhere classified: Secondary | ICD-10-CM

## 2015-03-08 NOTE — Therapy (Signed)
[place], [place], [address] Phone: [phone]   Fax:  [phone]  Physical Therapy Treatment  Patient Details  Name: Ryan Meza MRN: [id number] Date of Birth: 1940/11/13 Referring Provider:  Gaynelle Arabian, MD  Encounter Date: 03/08/2015      PT End of Session - 03/08/15 1517    Visit Number 5   Date for PT Re-Evaluation 04/23/15   PT Start Time 2563   PT Stop Time 1530   PT Time Calculation (min) 62 min      Past Medical History  Diagnosis Date  . Hyperlipidemia     under control  . GERD (gastroesophageal reflux disease)   . Wears glasses   . Full dentures   . Arthritis   . Double vision     occasional  . Glaucoma     right eye  . Hypertension     under control  . History of blood transfusion 2013    2 unit, no reaction    Past Surgical History  Procedure Laterality Date  . Prostate biopsy  3-4 yrs ago  . Prostate biopsy    . Cystoscopy  08/26/2012    Procedure: CYSTOSCOPY;  Surgeon: Fredricka Bonine, MD;  Location: [facility];  Service: Urology;  Laterality: N/A;  . Prostatectomy  10/03/2012    Procedure: PROSTATECTOMY;  Surgeon: Fredricka Bonine, MD;  Location: [facility];  Service: Urology;  Laterality: N/A;  Open Simple Prostatectomy  . Colonoscopy  2006  . Inguinal hernia repair Right 06/04/2014    Procedure: OPEN REPAIR RIGHT INGUINAL HERNIA REPAIR;  Surgeon: Adin Hector, MD;  Location: [facility];  Service: General;  Laterality: Right;  . Insertion of mesh N/A 06/04/2014    Procedure: INSERTION OF MESH;  Surgeon: Adin Hector, MD;  Location: [place];  Service: General;  Laterality: N/A;  . Eye surgery  2009    right cataract  . Eye surgery  10/2008    retina-right  . Total knee arthroplasty Left 08/30/2014    Procedure: Left TOTAL KNEE ARTHROPLASTY;  Surgeon: Gearlean Alf, MD;  Location: [facility];  Service:  Orthopedics;  Laterality: Left;  . Total knee arthroplasty Right 01/31/2015    Procedure: RIGHT TOTAL KNEE ARTHROPLASTY;  Surgeon: Gaynelle Arabian, MD;  Location: [facility];  Service: Orthopedics;  Laterality: Right;    There were no vitals filed for this visit.  Visit Diagnosis:  Knee stiffness, right  Right knee pain      Subjective Assessment - 03/08/15 1450    Subjective I am doing great.  No real big issues.   Currently in Pain? Yes   Pain Score 2    Pain Orientation Right   Pain Descriptors / Indicators Aching;Sore   Pain Type Surgical pain                         [facility] Adult PT Treatment/Exercise - 03/08/15 0001    Ambulation/Gait   Gait Comments gait without device, around the building 800 feet, worked on toe clears stepping over obstacles with the right leg being lead and following, also did stairs sep over step.   High Level Balance   High Level Balance Activities Side stepping;Backward walking;Tandem walking   Knee/Hip Exercises: Aerobic   Stationary Bike 6 minutes   Tread Mill Nustep Level 4 x 6 minutes   Knee/Hip Exercises: Doctor, general practice  Cybex Knee Extension 15#   Cybex Knee Flexion 35# some stretch into extension b/n sets   Cybex Leg Press 30# and then without weight for increase of flexion   Knee/Hip Exercises: Supine   Heel Prop for Knee Extension 2 minutes                  PT Short Term Goals - 02/21/15 1330    PT SHORT TERM GOAL #1   Title independent with initial HEP   Time 2   Period Weeks   Status New           PT Long Term Goals - 03/08/15 1520    PT LONG TERM GOAL #1   Title independent with RICE   Status Achieved   PT LONG TERM GOAL #2   Title increase AROM of the right knee to 8-115 degrees flexion   Baseline currently on 03/08/15 10-115 degrees   Status Partially Met   PT LONG TERM GOAL #3   Title decrease pain with sitting by 50%   PT LONG TERM GOAL #4   Title walk community distances without  cane   Status Achieved               Plan - 03/08/15 1518    Clinical Impression Statement Overall Patient is doing great.  No difficulty with walking, or stairs, he lacks TKE, but has the ability to do at home, just has pretty good pain with low load long duration stretch.   PT Next Visit Plan We could Discharge patient and have him focus on TKE and HS stretches at home.  If you feel that we need to work more on this we will.   Consulted and Agree with Plan of Care Patient        Problem List Patient Active Problem List   Diagnosis Date Noted  . OA (osteoarthritis) of knee 08/30/2014  . Right inguinal hernia 05/07/2014    Sumner Boast, PT 03/08/2015, 3:22 PM  [place], [place], [address] Phone: [phone]   Fax:  [phone]

## 2015-03-11 ENCOUNTER — Ambulatory Visit: Payer: Medicare Other | Admitting: Physical Therapy

## 2015-04-25 ENCOUNTER — Other Ambulatory Visit: Payer: Self-pay

## 2020-08-27 ENCOUNTER — Ambulatory Visit: Payer: Medicare Other | Attending: Internal Medicine

## 2020-08-27 DIAGNOSIS — Z23 Encounter for immunization: Secondary | ICD-10-CM

## 2020-08-27 NOTE — Progress Notes (Signed)
° °  Covid-19 Vaccination Clinic  Name:  Ryan Meza    MRN: [id number] DOB: Feb 28, 1941  08/27/2020  Mr. Ryan Meza was observed post Covid-19 immunization for 15 minutes without incident. He was provided with Vaccine Information Sheet and instruction to access the V-Safe system.   Mr. Ryan Meza was instructed to call 911 with any severe reactions post vaccine:  Difficulty breathing   Swelling of face and throat   A fast heartbeat   A bad rash all over body   Dizziness and weakness

## 2021-06-07 ENCOUNTER — Other Ambulatory Visit: Payer: Self-pay | Admitting: Internal Medicine

## 2021-06-07 DIAGNOSIS — E785 Hyperlipidemia, unspecified: Secondary | ICD-10-CM

## 2021-07-14 ENCOUNTER — Ambulatory Visit
Admission: RE | Admit: 2021-07-14 | Discharge: 2021-07-14 | Disposition: A | Discharge status: Home / Self Care | Payer: No Typology Code available for payment source | Source: Ambulatory Visit | Attending: Internal Medicine | Admitting: Internal Medicine

## 2021-07-14 DIAGNOSIS — E785 Hyperlipidemia, unspecified: Secondary | ICD-10-CM

## 2023-03-07 ENCOUNTER — Other Ambulatory Visit: Payer: Self-pay | Admitting: Urology

## 2023-03-07 DIAGNOSIS — R972 Elevated prostate specific antigen [PSA]: Secondary | ICD-10-CM

## 2023-04-08 IMAGING — CT CT CARDIAC CORONARY ARTERY CALCIUM SCORE
3 series · 14 of 20 positions shown, 16 images · non-contrast
Comparison: None.

CLINICAL DATA: 79-year-old Caucasian male with history of
hyperlipidemia, hypertension and prior smoking history.

EXAM:
CT CARDIAC CORONARY ARTERY CALCIUM SCORE
TECHNIQUE: Non-contrast imaging through the heart was performed using
prospective ECG gating. Image post processing was performed on an
independent workstation, allowing for quantitative analysis of the
heart and coronary arteries. Note that this exam targets the heart
and the chest was not imaged in its entirety.

[Series 2: calcium scoring 2.00 qr36 bestdiast 71% hrt calciu · axial · 0.39mm/px · z∈[+1760,+1856]mm · 4 of 80 slices shown]
[im 16/80  vessel]
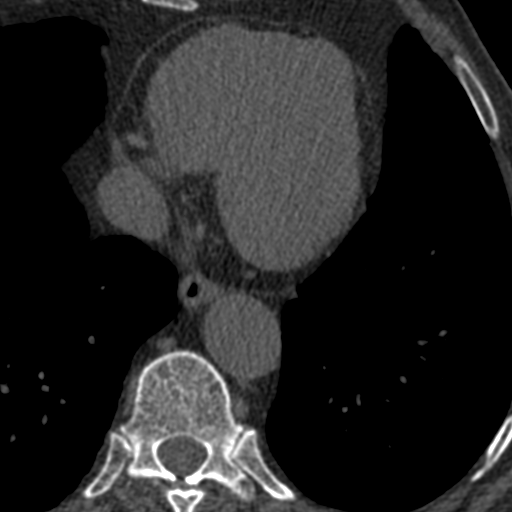
[im 32/80  vessel]
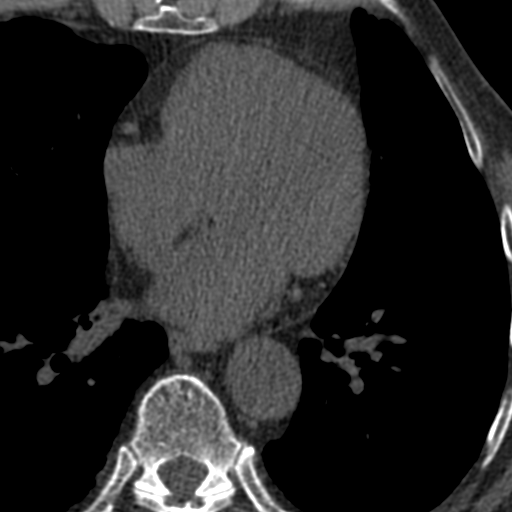
[im 48/80  vessel]
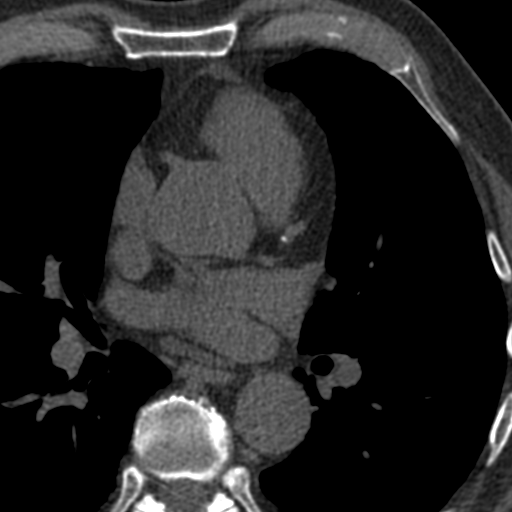
[im 64/80  vessel]
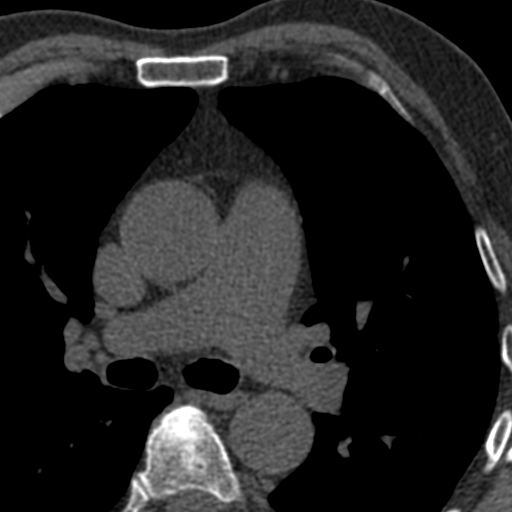

[Series 3: calcium scoring 2.00 br40 bestdiast 71% axial · axial · 0.59mm/px · z∈[+1756,+1860]mm · 5 of 80 slices shown, 7 images]
[im 14/80  vessel]
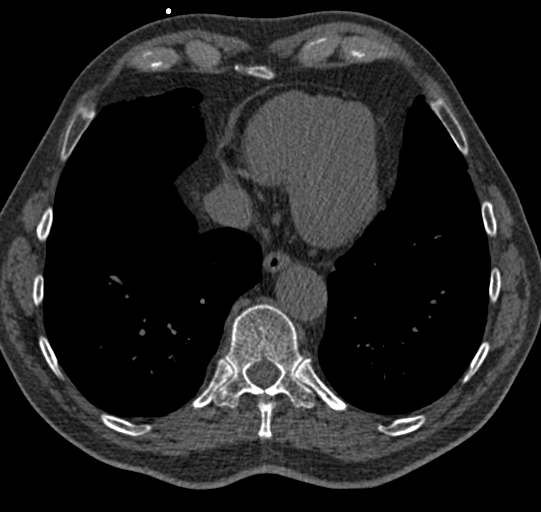
[im 14/80  lung]
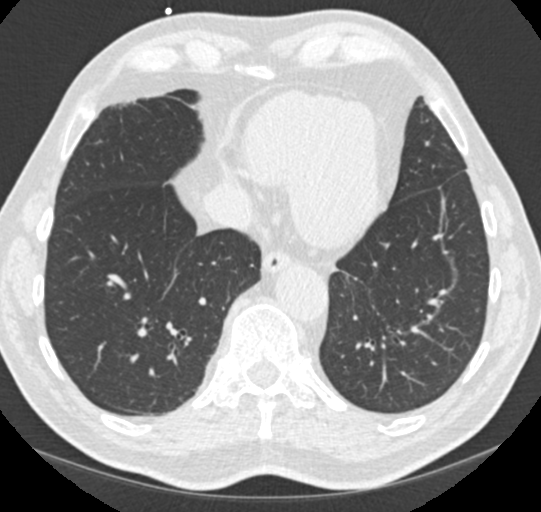
[im 27/80  vessel]
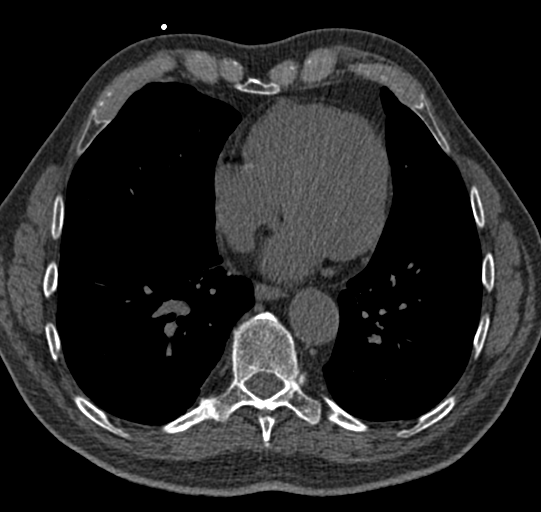
[im 40/80  vessel]
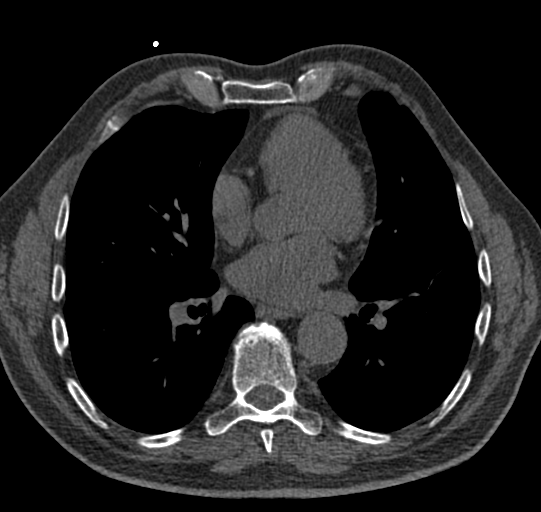
[im 53/80  vessel]
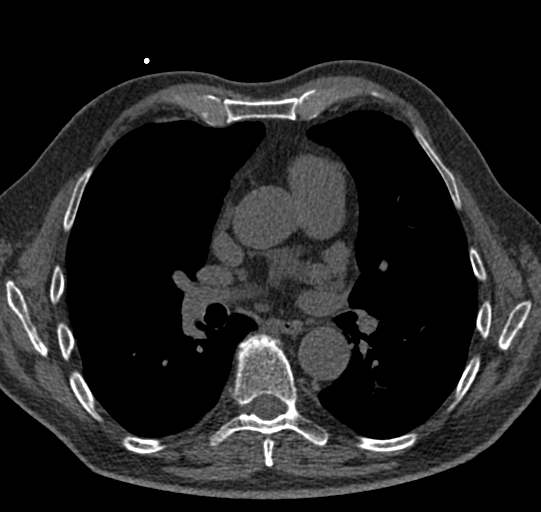
[im 66/80  vessel]
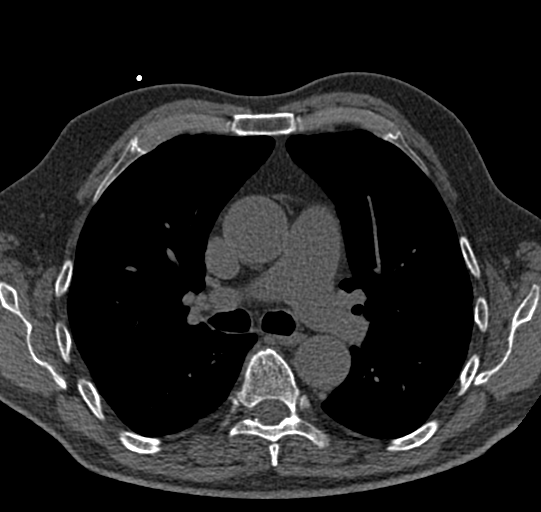
[im 66/80  lung]
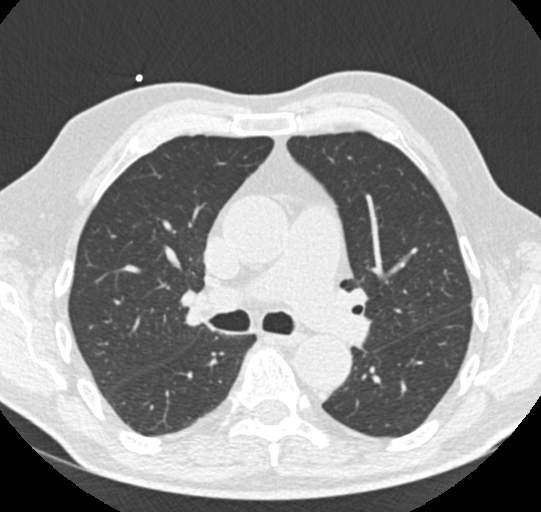

[Series 9: calcium scoring 2.00 br60 bestdiast 71% lungs · axial · 0.60mm/px · z∈[+1756,+1860]mm · 5 of 80 slices shown]
[im 14/80  vessel]
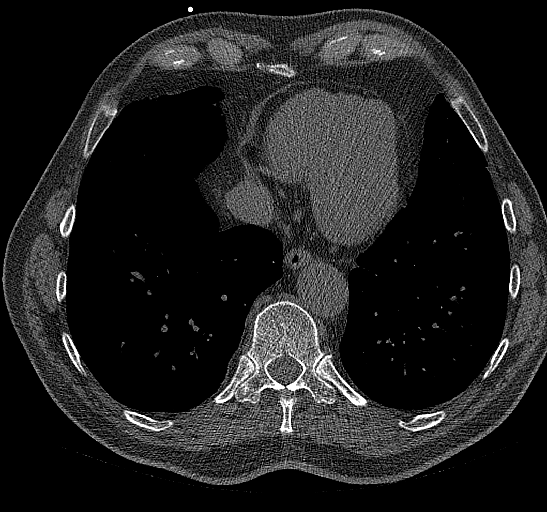
[im 27/80  vessel]
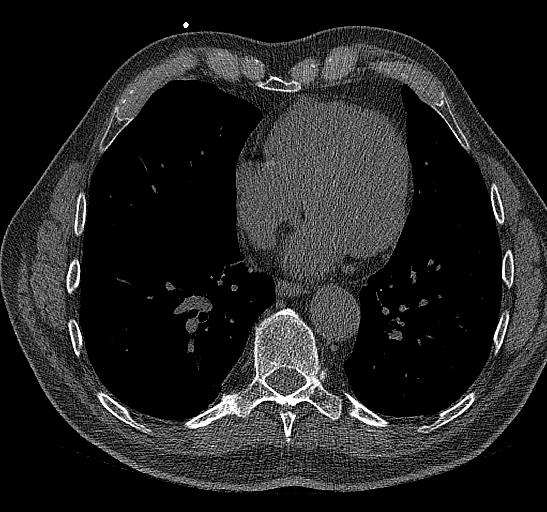
[im 40/80  vessel]
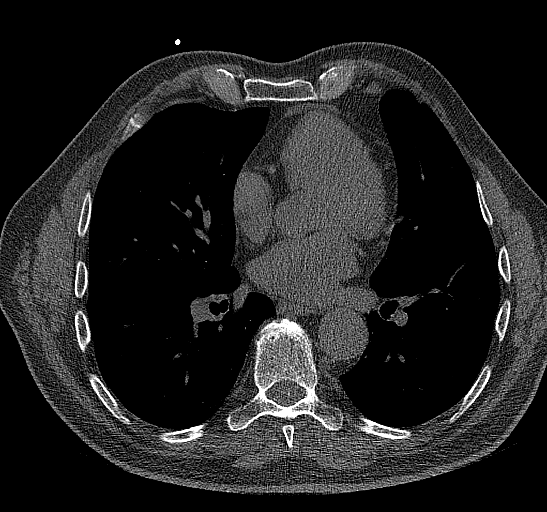
[im 53/80  vessel]
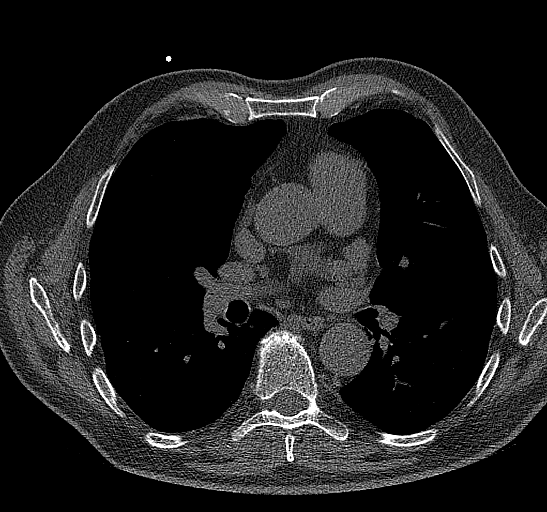
[im 66/80  vessel]
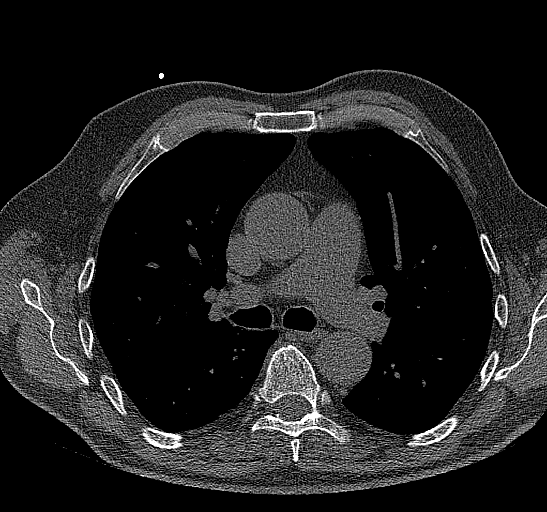

[14 of 20 positions shown; findings below may reference images not displayed]

FINDINGS: CORONARY CALCIUM SCORES:

Left Main: 0

LAD: 17

LCx: 0

RCA: 0

Total Agatston Score: 17

[HOSPITAL] percentile: 5

AORTA MEASUREMENTS:

Ascending Aorta: 38 mm

Descending Aorta: 29 mm

OTHER FINDINGS:

The heart size is within normal limits. No pericardial fluid is
identified. Mild calcified plaque is present in the thoracic aorta.
Visualized segments of the thoracic aorta and central pulmonary
arteries are normal in caliber. Visualized mediastinum and hilar
regions demonstrate no lymphadenopathy or masses. Mild pulmonary
scarring at both lung bases. Visualized lungs show no evidence of
pulmonary edema, consolidation, pneumothorax, nodule or pleural
fluid. Visualized upper abdomen and bony structures are
unremarkable.
IMPRESSION: Coronary calcium score 17 is at the 5th percentile for the patient's
age, sex and race.

## 2023-04-18 ENCOUNTER — Ambulatory Visit
Admission: RE | Admit: 2023-04-18 | Discharge: 2023-04-18 | Disposition: A | Discharge status: Home / Self Care | Payer: Medicare Other | Source: Ambulatory Visit | Attending: Urology | Admitting: Urology

## 2023-04-18 DIAGNOSIS — R972 Elevated prostate specific antigen [PSA]: Secondary | ICD-10-CM

## 2023-04-18 MED ORDER — GADOPICLENOL 0.5 MMOL/ML IV SOLN
8.0000 mL | Freq: Once | INTRAVENOUS | Status: AC | PRN
  Administered 2023-04-18: 8 mL via INTRAVENOUS
